# Patient Record
Sex: Female | Born: 1985 | Race: White | Hispanic: No | Marital: Single | Smoking: Current every day smoker
Health system: Southern US, Community
[De-identification: ages and names within clinical notes are randomized; demographics above are authoritative.]

## PROBLEM LIST (undated history)

## (undated) DIAGNOSIS — F319 Bipolar disorder, unspecified: Secondary | ICD-10-CM

## (undated) DIAGNOSIS — N200 Calculus of kidney: Secondary | ICD-10-CM

## (undated) DIAGNOSIS — F419 Anxiety disorder, unspecified: Secondary | ICD-10-CM

## (undated) HISTORY — PX: TONSILLECTOMY: SUR1361

## (undated) HISTORY — PX: LEEP: SHX91

## (undated) HISTORY — PX: CERVICAL BIOPSY  W/ LOOP ELECTRODE EXCISION: SUR135

---
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
## 2008-04-14 ENCOUNTER — Emergency Department (HOSPITAL_COMMUNITY): Admission: EM | Admit: 2008-04-14 | Discharge: 2008-04-15 | Payer: Self-pay | Admitting: Emergency Medicine

## 2008-05-09 ENCOUNTER — Emergency Department (HOSPITAL_COMMUNITY): Admission: EM | Admit: 2008-05-09 | Discharge: 2008-05-10 | Payer: Self-pay | Admitting: Emergency Medicine

## 2008-06-30 ENCOUNTER — Emergency Department (HOSPITAL_COMMUNITY): Admission: EM | Admit: 2008-06-30 | Discharge: 2008-07-01 | Payer: Self-pay | Admitting: Emergency Medicine

## 2008-07-25 ENCOUNTER — Emergency Department (HOSPITAL_COMMUNITY): Admission: EM | Admit: 2008-07-25 | Discharge: 2008-07-26 | Payer: Self-pay | Admitting: Emergency Medicine

## 2008-08-15 ENCOUNTER — Emergency Department (HOSPITAL_COMMUNITY): Admission: EM | Admit: 2008-08-15 | Discharge: 2008-08-15 | Payer: Self-pay | Admitting: Emergency Medicine

## 2008-08-24 ENCOUNTER — Emergency Department (HOSPITAL_COMMUNITY): Admission: EM | Admit: 2008-08-24 | Discharge: 2008-08-24 | Payer: Self-pay | Admitting: Emergency Medicine

## 2008-09-14 ENCOUNTER — Emergency Department (HOSPITAL_COMMUNITY): Admission: EM | Admit: 2008-09-14 | Discharge: 2008-09-14 | Payer: Self-pay | Admitting: Emergency Medicine

## 2008-09-30 ENCOUNTER — Emergency Department (HOSPITAL_COMMUNITY): Admission: EM | Admit: 2008-09-30 | Discharge: 2008-09-30 | Payer: Self-pay | Admitting: Emergency Medicine

## 2008-11-09 ENCOUNTER — Emergency Department (HOSPITAL_COMMUNITY): Admission: EM | Admit: 2008-11-09 | Discharge: 2008-11-09 | Payer: Self-pay | Admitting: Emergency Medicine

## 2008-11-18 ENCOUNTER — Emergency Department (HOSPITAL_COMMUNITY): Admission: EM | Admit: 2008-11-18 | Discharge: 2008-11-18 | Payer: Self-pay | Admitting: Emergency Medicine

## 2008-12-05 ENCOUNTER — Emergency Department (HOSPITAL_COMMUNITY): Admission: EM | Admit: 2008-12-05 | Discharge: 2008-12-05 | Payer: Self-pay | Admitting: Emergency Medicine

## 2008-12-22 ENCOUNTER — Emergency Department (HOSPITAL_COMMUNITY): Admission: EM | Admit: 2008-12-22 | Discharge: 2008-12-22 | Payer: Self-pay | Admitting: Emergency Medicine

## 2008-12-23 ENCOUNTER — Emergency Department (HOSPITAL_COMMUNITY): Admission: EM | Admit: 2008-12-23 | Discharge: 2008-12-23 | Payer: Self-pay | Admitting: Emergency Medicine

## 2009-06-22 ENCOUNTER — Emergency Department (HOSPITAL_BASED_OUTPATIENT_CLINIC_OR_DEPARTMENT_OTHER): Admission: EM | Admit: 2009-06-22 | Discharge: 2009-06-22 | Payer: Self-pay | Admitting: Emergency Medicine

## 2009-07-29 ENCOUNTER — Emergency Department (HOSPITAL_BASED_OUTPATIENT_CLINIC_OR_DEPARTMENT_OTHER): Admission: EM | Admit: 2009-07-29 | Discharge: 2009-07-29 | Payer: Self-pay | Admitting: Emergency Medicine

## 2009-07-29 ENCOUNTER — Ambulatory Visit: Payer: Self-pay | Admitting: Diagnostic Radiology

## 2009-08-24 ENCOUNTER — Emergency Department (HOSPITAL_BASED_OUTPATIENT_CLINIC_OR_DEPARTMENT_OTHER): Admission: EM | Admit: 2009-08-24 | Discharge: 2009-08-24 | Payer: Self-pay | Admitting: Emergency Medicine

## 2009-09-18 ENCOUNTER — Emergency Department (HOSPITAL_BASED_OUTPATIENT_CLINIC_OR_DEPARTMENT_OTHER): Admission: EM | Admit: 2009-09-18 | Discharge: 2009-09-18 | Payer: Self-pay | Admitting: Emergency Medicine

## 2009-09-18 ENCOUNTER — Ambulatory Visit: Payer: Self-pay | Admitting: Interventional Radiology

## 2009-09-30 ENCOUNTER — Emergency Department (HOSPITAL_BASED_OUTPATIENT_CLINIC_OR_DEPARTMENT_OTHER): Admission: EM | Admit: 2009-09-30 | Discharge: 2009-09-30 | Payer: Self-pay | Admitting: Emergency Medicine

## 2009-09-30 ENCOUNTER — Ambulatory Visit: Payer: Self-pay | Admitting: Diagnostic Radiology

## 2009-10-15 ENCOUNTER — Ambulatory Visit: Payer: Self-pay | Admitting: Diagnostic Radiology

## 2009-10-15 ENCOUNTER — Emergency Department (HOSPITAL_BASED_OUTPATIENT_CLINIC_OR_DEPARTMENT_OTHER): Admission: EM | Admit: 2009-10-15 | Discharge: 2009-10-15 | Payer: Self-pay | Admitting: Emergency Medicine

## 2009-10-18 ENCOUNTER — Emergency Department (HOSPITAL_BASED_OUTPATIENT_CLINIC_OR_DEPARTMENT_OTHER): Admission: EM | Admit: 2009-10-18 | Discharge: 2009-10-18 | Payer: Self-pay | Admitting: Emergency Medicine

## 2009-10-29 ENCOUNTER — Emergency Department (HOSPITAL_BASED_OUTPATIENT_CLINIC_OR_DEPARTMENT_OTHER): Admission: EM | Admit: 2009-10-29 | Discharge: 2009-10-29 | Payer: Self-pay | Admitting: Emergency Medicine

## 2009-11-21 ENCOUNTER — Emergency Department (HOSPITAL_BASED_OUTPATIENT_CLINIC_OR_DEPARTMENT_OTHER): Admission: EM | Admit: 2009-11-21 | Discharge: 2009-11-21 | Payer: Self-pay | Admitting: Emergency Medicine

## 2009-11-21 ENCOUNTER — Ambulatory Visit: Payer: Self-pay | Admitting: Diagnostic Radiology

## 2010-01-12 ENCOUNTER — Emergency Department (HOSPITAL_BASED_OUTPATIENT_CLINIC_OR_DEPARTMENT_OTHER): Admission: EM | Admit: 2010-01-12 | Discharge: 2010-01-12 | Payer: Self-pay | Admitting: Emergency Medicine

## 2010-01-21 ENCOUNTER — Emergency Department (HOSPITAL_BASED_OUTPATIENT_CLINIC_OR_DEPARTMENT_OTHER)
Admission: EM | Admit: 2010-01-21 | Discharge: 2010-01-21 | Payer: Self-pay | Source: Home / Self Care | Admitting: Emergency Medicine

## 2010-02-06 ENCOUNTER — Emergency Department (HOSPITAL_BASED_OUTPATIENT_CLINIC_OR_DEPARTMENT_OTHER)
Admission: EM | Admit: 2010-02-06 | Discharge: 2010-02-07 | Payer: Self-pay | Source: Home / Self Care | Admitting: Emergency Medicine

## 2010-03-16 ENCOUNTER — Emergency Department (HOSPITAL_BASED_OUTPATIENT_CLINIC_OR_DEPARTMENT_OTHER)
Admission: EM | Admit: 2010-03-16 | Discharge: 2010-03-17 | Disposition: A | Discharge status: Home / Self Care | Payer: Medicaid Other | Attending: Emergency Medicine | Admitting: Emergency Medicine

## 2010-03-16 DIAGNOSIS — M5430 Sciatica, unspecified side: Secondary | ICD-10-CM | POA: Insufficient documentation

## 2010-03-16 DIAGNOSIS — F319 Bipolar disorder, unspecified: Secondary | ICD-10-CM | POA: Insufficient documentation

## 2010-03-16 DIAGNOSIS — M549 Dorsalgia, unspecified: Secondary | ICD-10-CM | POA: Insufficient documentation

## 2010-03-16 DIAGNOSIS — J45909 Unspecified asthma, uncomplicated: Secondary | ICD-10-CM | POA: Insufficient documentation

## 2010-03-16 DIAGNOSIS — F17200 Nicotine dependence, unspecified, uncomplicated: Secondary | ICD-10-CM | POA: Insufficient documentation

## 2010-04-18 ENCOUNTER — Emergency Department (HOSPITAL_BASED_OUTPATIENT_CLINIC_OR_DEPARTMENT_OTHER)
Admission: EM | Admit: 2010-04-18 | Discharge: 2010-04-19 | Disposition: A | Discharge status: Home / Self Care | Payer: Medicaid Other | Attending: Emergency Medicine | Admitting: Emergency Medicine

## 2010-04-18 DIAGNOSIS — J069 Acute upper respiratory infection, unspecified: Secondary | ICD-10-CM | POA: Insufficient documentation

## 2010-04-18 DIAGNOSIS — F319 Bipolar disorder, unspecified: Secondary | ICD-10-CM | POA: Insufficient documentation

## 2010-04-18 DIAGNOSIS — F17200 Nicotine dependence, unspecified, uncomplicated: Secondary | ICD-10-CM | POA: Insufficient documentation

## 2010-04-23 LAB — DIFFERENTIAL
Basophils Relative: 0 % (ref 0–1)
Lymphs Abs: 1.8 10*3/uL (ref 0.7–4.0)
Monocytes Relative: 6 % (ref 3–12)
Neutro Abs: 7.1 10*3/uL (ref 1.7–7.7)
Neutrophils Relative %: 75 % (ref 43–77)

## 2010-04-23 LAB — URINALYSIS, ROUTINE W REFLEX MICROSCOPIC
Bilirubin Urine: NEGATIVE
Glucose, UA: NEGATIVE mg/dL
Ketones, ur: NEGATIVE mg/dL
Protein, ur: NEGATIVE mg/dL
pH: 7.5 (ref 5.0–8.0)

## 2010-04-23 LAB — GC/CHLAMYDIA PROBE AMP, GENITAL
Chlamydia, DNA Probe: NEGATIVE
GC Probe Amp, Genital: NEGATIVE

## 2010-04-23 LAB — CBC
Hemoglobin: 13 g/dL (ref 12.0–15.0)
Platelets: 185 10*3/uL (ref 150–400)
RBC: 4.05 MIL/uL (ref 3.87–5.11)
WBC: 9.5 10*3/uL (ref 4.0–10.5)

## 2010-04-23 LAB — PREGNANCY, URINE: Preg Test, Ur: NEGATIVE

## 2010-04-23 LAB — COMPREHENSIVE METABOLIC PANEL
BUN: 7 mg/dL (ref 6–23)
Calcium: 9.7 mg/dL (ref 8.4–10.5)
GFR calc Af Amer: >60 mL/min (ref >60)
Glucose, Bld: 87 mg/dL (ref 70–99)
Potassium: 3.8 mEq/L (ref 3.5–5.1)
Sodium: 145 mEq/L (ref 135–145)
Total Protein: 7.7 g/dL (ref 6.0–8.3)

## 2010-04-23 LAB — LIPASE, BLOOD: Lipase: 41 U/L (ref 23–300)

## 2010-04-23 LAB — WET PREP, GENITAL

## 2010-04-23 LAB — URINE MICROSCOPIC-ADD ON

## 2010-04-26 LAB — CBC
MCH: 33.1 pg (ref 26.0–34.0)
MCHC: 33.5 g/dL (ref 30.0–36.0)
MCV: 98.6 fL (ref 78.0–100.0)
Platelets: 188 10*3/uL (ref 150–400)
RDW: 13.7 % (ref 11.5–15.5)

## 2010-04-26 LAB — DIFFERENTIAL
Basophils Absolute: 0.1 10*3/uL (ref 0.0–0.1)
Basophils Relative: 1 % (ref 0–1)
Eosinophils Absolute: 0.1 10*3/uL (ref 0.0–0.7)
Eosinophils Relative: 1 % (ref 0–5)
Neutrophils Relative %: 72 % (ref 43–77)

## 2010-04-26 LAB — WET PREP, GENITAL

## 2010-04-26 LAB — PREGNANCY, URINE: Preg Test, Ur: NEGATIVE

## 2010-04-26 LAB — URINALYSIS, ROUTINE W REFLEX MICROSCOPIC
Glucose, UA: NEGATIVE mg/dL
Ketones, ur: NEGATIVE mg/dL
Nitrite: NEGATIVE
Protein, ur: 30 mg/dL — AB
pH: 6.5 (ref 5.0–8.0)

## 2010-04-26 LAB — BASIC METABOLIC PANEL
BUN: 11 mg/dL (ref 6–23)
CO2: 27 mEq/L (ref 19–32)
Calcium: 9.2 mg/dL (ref 8.4–10.5)
Chloride: 108 mEq/L (ref 96–112)
Creatinine, Ser: 0.7 mg/dL (ref 0.4–1.2)

## 2010-04-26 LAB — URINE MICROSCOPIC-ADD ON

## 2010-04-26 LAB — GC/CHLAMYDIA PROBE AMP, GENITAL: GC Probe Amp, Genital: NEGATIVE

## 2010-04-27 LAB — URINE MICROSCOPIC-ADD ON

## 2010-04-27 LAB — URINALYSIS, ROUTINE W REFLEX MICROSCOPIC
Glucose, UA: NEGATIVE mg/dL
Protein, ur: NEGATIVE mg/dL
Specific Gravity, Urine: 1.028 (ref 1.005–1.030)
pH: 6 (ref 5.0–8.0)

## 2010-04-27 LAB — URINE CULTURE: Culture  Setup Time: 201108200644

## 2010-05-16 LAB — URINALYSIS, ROUTINE W REFLEX MICROSCOPIC
Ketones, ur: NEGATIVE mg/dL
Nitrite: NEGATIVE
Protein, ur: NEGATIVE mg/dL
pH: 7 (ref 5.0–8.0)

## 2010-05-16 LAB — POCT PREGNANCY, URINE: Preg Test, Ur: NEGATIVE

## 2010-05-16 LAB — RAPID URINE DRUG SCREEN, HOSP PERFORMED: Cocaine: NOT DETECTED

## 2010-05-19 LAB — URINALYSIS, ROUTINE W REFLEX MICROSCOPIC
Hgb urine dipstick: NEGATIVE
Ketones, ur: NEGATIVE mg/dL
Protein, ur: NEGATIVE mg/dL
Urobilinogen, UA: 1 mg/dL (ref 0.0–1.0)

## 2010-05-19 LAB — CBC
HCT: 35.6 % — ABNORMAL LOW (ref 36.0–46.0)
Hemoglobin: 12.2 g/dL (ref 12.0–15.0)
WBC: 9.9 10*3/uL (ref 4.0–10.5)

## 2010-05-19 LAB — COMPREHENSIVE METABOLIC PANEL
Alkaline Phosphatase: 81 U/L (ref 39–117)
BUN: 9 mg/dL (ref 6–23)
Chloride: 107 mEq/L (ref 96–112)
Glucose, Bld: 96 mg/dL (ref 70–99)
Potassium: 4.1 mEq/L (ref 3.5–5.1)
Total Bilirubin: 0.4 mg/dL (ref 0.3–1.2)

## 2010-05-19 LAB — DIFFERENTIAL
Basophils Absolute: 0 10*3/uL (ref 0.0–0.1)
Basophils Relative: 0 % (ref 0–1)
Monocytes Absolute: 0.7 10*3/uL (ref 0.1–1.0)
Neutro Abs: 6.4 10*3/uL (ref 1.7–7.7)
Neutrophils Relative %: 64 % (ref 43–77)

## 2010-05-19 LAB — POCT PREGNANCY, URINE: Preg Test, Ur: NEGATIVE

## 2010-05-20 LAB — DIFFERENTIAL
Basophils Absolute: 0 10*3/uL (ref 0.0–0.1)
Basophils Relative: 0 % (ref 0–1)
Basophils Relative: 1 % (ref 0–1)
Eosinophils Absolute: 0.1 10*3/uL (ref 0.0–0.7)
Lymphocytes Relative: 13 % (ref 12–46)
Lymphs Abs: 2.1 10*3/uL (ref 0.7–4.0)
Monocytes Absolute: 0.4 10*3/uL (ref 0.1–1.0)
Monocytes Absolute: 0.8 10*3/uL (ref 0.1–1.0)
Monocytes Relative: 8 % (ref 3–12)
Neutro Abs: 7 10*3/uL (ref 1.7–7.7)
Neutrophils Relative %: 81 % — ABNORMAL HIGH (ref 43–77)

## 2010-05-20 LAB — CBC
HCT: 36.4 % (ref 36.0–46.0)
HCT: 39 % (ref 36.0–46.0)
Hemoglobin: 12.6 g/dL (ref 12.0–15.0)
Hemoglobin: 13.5 g/dL (ref 12.0–15.0)
MCHC: 34.7 g/dL (ref 30.0–36.0)
MCV: 96.5 fL (ref 78.0–100.0)
MCV: 96.6 fL (ref 78.0–100.0)
Platelets: 224 10*3/uL (ref 150–400)
RBC: 3.77 MIL/uL — ABNORMAL LOW (ref 3.87–5.11)
WBC: 8.7 10*3/uL (ref 4.0–10.5)

## 2010-05-20 LAB — COMPREHENSIVE METABOLIC PANEL
Albumin: 4 g/dL (ref 3.5–5.2)
Alkaline Phosphatase: 77 U/L (ref 39–117)
BUN: 12 mg/dL (ref 6–23)
BUN: 6 mg/dL (ref 6–23)
CO2: 25 mEq/L (ref 19–32)
Calcium: 9.1 mg/dL (ref 8.4–10.5)
Chloride: 106 mEq/L (ref 96–112)
Chloride: 109 mEq/L (ref 96–112)
Creatinine, Ser: 0.6 mg/dL (ref 0.4–1.2)
Creatinine, Ser: 0.68 mg/dL (ref 0.4–1.2)
GFR calc Af Amer: >60 mL/min (ref >60)
GFR calc non Af Amer: >60 mL/min (ref >60)
GFR calc non Af Amer: >60 mL/min (ref >60)
Glucose, Bld: 101 mg/dL — ABNORMAL HIGH (ref 70–99)
Glucose, Bld: 86 mg/dL (ref 70–99)
Potassium: 4.1 mEq/L (ref 3.5–5.1)
Total Bilirubin: 0.6 mg/dL (ref 0.3–1.2)
Total Bilirubin: 0.8 mg/dL (ref 0.3–1.2)

## 2010-05-20 LAB — GC/CHLAMYDIA PROBE AMP, GENITAL
Chlamydia, DNA Probe: NEGATIVE
GC Probe Amp, Genital: NEGATIVE

## 2010-05-20 LAB — URINALYSIS, ROUTINE W REFLEX MICROSCOPIC
Bilirubin Urine: NEGATIVE
Bilirubin Urine: NEGATIVE
Glucose, UA: NEGATIVE mg/dL
Hgb urine dipstick: NEGATIVE
Hgb urine dipstick: NEGATIVE
Ketones, ur: 15 mg/dL — AB
Nitrite: NEGATIVE
Protein, ur: NEGATIVE mg/dL
Urobilinogen, UA: 1 mg/dL (ref 0.0–1.0)
pH: 6.5 (ref 5.0–8.0)

## 2010-05-20 LAB — WET PREP, GENITAL
Trich, Wet Prep: NONE SEEN
Yeast Wet Prep HPF POC: NONE SEEN

## 2010-05-21 LAB — COMPREHENSIVE METABOLIC PANEL
ALT: 11 U/L (ref 0–35)
Alkaline Phosphatase: 87 U/L (ref 39–117)
BUN: 8 mg/dL (ref 6–23)
CO2: 25 mEq/L (ref 19–32)
Calcium: 10.1 mg/dL (ref 8.4–10.5)
GFR calc non Af Amer: >60 mL/min (ref >60)
Glucose, Bld: 89 mg/dL (ref 70–99)
Sodium: 143 mEq/L (ref 135–145)
Total Protein: 7.7 g/dL (ref 6.0–8.3)

## 2010-05-21 LAB — DIFFERENTIAL
Basophils Relative: 0 % (ref 0–1)
Eosinophils Absolute: 0.1 10*3/uL (ref 0.0–0.7)
Lymphs Abs: 1.4 10*3/uL (ref 0.7–4.0)
Neutro Abs: 8.7 10*3/uL — ABNORMAL HIGH (ref 1.7–7.7)
Neutrophils Relative %: 82 % — ABNORMAL HIGH (ref 43–77)

## 2010-05-21 LAB — CBC
HCT: 41.3 % (ref 36.0–46.0)
Hemoglobin: 14.2 g/dL (ref 12.0–15.0)
MCHC: 34.3 g/dL (ref 30.0–36.0)
RBC: 4.3 MIL/uL (ref 3.87–5.11)
RDW: 13.2 % (ref 11.5–15.5)

## 2010-05-21 LAB — URINALYSIS, ROUTINE W REFLEX MICROSCOPIC
Bilirubin Urine: NEGATIVE
Glucose, UA: NEGATIVE mg/dL
Hgb urine dipstick: NEGATIVE
Ketones, ur: NEGATIVE mg/dL
Protein, ur: NEGATIVE mg/dL
pH: 8 (ref 5.0–8.0)

## 2010-05-21 LAB — LIPASE, BLOOD: Lipase: 17 U/L (ref 11–59)

## 2010-05-22 LAB — HEPATIC FUNCTION PANEL
ALT: 17 U/L (ref 0–35)
Albumin: 4 g/dL (ref 3.5–5.2)
Alkaline Phosphatase: 98 U/L (ref 39–117)
Indirect Bilirubin: 0.6 mg/dL (ref 0.3–0.9)
Total Protein: 7.3 g/dL (ref 6.0–8.3)

## 2010-05-22 LAB — URINALYSIS, ROUTINE W REFLEX MICROSCOPIC
Ketones, ur: NEGATIVE mg/dL
Nitrite: NEGATIVE
Protein, ur: NEGATIVE mg/dL
Urobilinogen, UA: 1 mg/dL (ref 0.0–1.0)

## 2010-05-22 LAB — LIPASE, BLOOD: Lipase: 20 U/L (ref 11–59)

## 2010-05-22 LAB — CBC
HCT: 40.2 % (ref 36.0–46.0)
Hemoglobin: 14.2 g/dL (ref 12.0–15.0)
MCV: 96.2 fL (ref 78.0–100.0)
RBC: 4.18 MIL/uL (ref 3.87–5.11)
WBC: 13.6 10*3/uL — ABNORMAL HIGH (ref 4.0–10.5)

## 2010-05-22 LAB — DIFFERENTIAL
Eosinophils Absolute: 0.1 10*3/uL (ref 0.0–0.7)
Eosinophils Relative: 1 % (ref 0–5)
Lymphs Abs: 2.1 10*3/uL (ref 0.7–4.0)
Monocytes Absolute: 0.7 10*3/uL (ref 0.1–1.0)
Monocytes Relative: 5 % (ref 3–12)
Neutrophils Relative %: 78 % — ABNORMAL HIGH (ref 43–77)

## 2010-05-22 LAB — URINE MICROSCOPIC-ADD ON

## 2010-05-22 LAB — POCT I-STAT, CHEM 8
BUN: 11 mg/dL (ref 6–23)
HCT: 42 % (ref 36.0–46.0)
Potassium: 3.8 mEq/L (ref 3.5–5.1)
TCO2: 23 mmol/L (ref 0–100)

## 2010-05-24 LAB — URINALYSIS, ROUTINE W REFLEX MICROSCOPIC
Bilirubin Urine: NEGATIVE
Glucose, UA: NEGATIVE mg/dL
Ketones, ur: NEGATIVE mg/dL
Nitrite: NEGATIVE
Specific Gravity, Urine: 1.034 — ABNORMAL HIGH (ref 1.005–1.030)
Specific Gravity, Urine: 1.023 (ref 1.005–1.030)
Urobilinogen, UA: 0.2 mg/dL (ref 0.0–1.0)
pH: 8 (ref 5.0–8.0)

## 2010-05-24 LAB — COMPREHENSIVE METABOLIC PANEL
ALT: 10 U/L (ref 0–35)
Albumin: 3.7 g/dL (ref 3.5–5.2)
Alkaline Phosphatase: 78 U/L (ref 39–117)
BUN: 9 mg/dL (ref 6–23)
Chloride: 107 mEq/L (ref 96–112)
Potassium: 3.9 mEq/L (ref 3.5–5.1)
Sodium: 138 mEq/L (ref 135–145)
Total Bilirubin: 0.6 mg/dL (ref 0.3–1.2)
Total Protein: 6.7 g/dL (ref 6.0–8.3)

## 2010-05-24 LAB — URINE MICROSCOPIC-ADD ON

## 2010-05-24 LAB — PREGNANCY, URINE: Preg Test, Ur: NEGATIVE

## 2010-05-29 ENCOUNTER — Emergency Department (HOSPITAL_BASED_OUTPATIENT_CLINIC_OR_DEPARTMENT_OTHER)
Admission: EM | Admit: 2010-05-29 | Discharge: 2010-05-29 | Disposition: A | Discharge status: Home / Self Care | Payer: Medicaid Other | Attending: Emergency Medicine | Admitting: Emergency Medicine

## 2010-05-29 DIAGNOSIS — N390 Urinary tract infection, site not specified: Secondary | ICD-10-CM | POA: Insufficient documentation

## 2010-05-29 DIAGNOSIS — J45909 Unspecified asthma, uncomplicated: Secondary | ICD-10-CM | POA: Insufficient documentation

## 2010-05-29 DIAGNOSIS — Z79899 Other long term (current) drug therapy: Secondary | ICD-10-CM | POA: Insufficient documentation

## 2010-05-29 DIAGNOSIS — R109 Unspecified abdominal pain: Secondary | ICD-10-CM | POA: Insufficient documentation

## 2010-05-29 DIAGNOSIS — F319 Bipolar disorder, unspecified: Secondary | ICD-10-CM | POA: Insufficient documentation

## 2010-05-29 DIAGNOSIS — F17200 Nicotine dependence, unspecified, uncomplicated: Secondary | ICD-10-CM | POA: Insufficient documentation

## 2010-05-29 LAB — URINALYSIS, ROUTINE W REFLEX MICROSCOPIC
Hgb urine dipstick: NEGATIVE
Nitrite: NEGATIVE
Specific Gravity, Urine: 1.02 (ref 1.005–1.030)
Urobilinogen, UA: 1 mg/dL (ref 0.0–1.0)

## 2010-05-29 LAB — PREGNANCY, URINE: Preg Test, Ur: NEGATIVE

## 2010-05-29 LAB — WET PREP, GENITAL
Clue Cells Wet Prep HPF POC: NONE SEEN
Trich, Wet Prep: NONE SEEN

## 2010-05-29 LAB — URINE MICROSCOPIC-ADD ON

## 2010-05-31 LAB — URINE CULTURE: Colony Count: >=100000

## 2010-06-29 NOTE — Discharge Summary (Signed)
NAME:  [name], [name]                        ACCOUNT NO.:  [id number]   MEDICAL RECORD NO.:  [id number]                   PATIENT TYPE:  INP   LOCATION:  0105                                 [facility]ACILITY:  [facility]   PHYSICIAN:  Cindie Crumbly, M.D.               DATE OF BIRTH:  06-04-1985   DATE OF ADMISSION:  01/17/2002  DATE OF DISCHARGE:  01/26/2002                                 DISCHARGE SUMMARY   REASON FOR ADMISSION:  This 25 year old white female was admitted for  inpatient psychiatric stabilization because of a history of suicidal  ideation and a plan to harm herself.  For further history of present  illness, please see the patient's psychiatric admission assessment.   PHYSICAL EXAMINATION:  Physical examination at the time of admission was  significant for obesity and was otherwise unremarkable.   LABORATORY DATA:  The patient underwent a laboratory workup to rule out any  other medical problems contributing to her symptomatology.  Urine probe for  gonorrhea and Chlamydia were negative.  Urine drug screen was negative.  CBC  was unremarkable.  UA was unremarkable.  Routine chemistry panel was within  normal limits.  Hepatic panel was within normal limits.  GGT was within  normal limits.  TSH and free T4 were within normal limits.  RPR was  nonreactive.  The patient received no x-rays, no special procedures, no  additional consultations.  She sustained no complications during the course  of this hospitalization.   HOSPITAL COURSE:  On admission, the patient displayed depressed, irritable,  angry, grandiose, and labile mood.  She was oppositional, defiant, gamey,  and manipulative, psychomotor agitated with decreased concentration.  Her  speech was pressured with flight of ideas.  She was intrusive, easily  distracted by extraneous stimuli.  She was begun on a trial of lithium.  Initial blood level was 0.5 and the patient was titrated upward to a more  therapeutic dose.  At  the time of discharge, her blood level was 0.76.  She  denies any side effects.  She is tolerating this medicine well without any  difficulty.  She is actively participating in all aspects of the therapeutic  treatment program, no longer appears to be danger to herself or others and  consequently is felt to have reached her maximum benefits of hospitalization  and is ready for discharge to a less restrictive alternative setting.   CONDITION ON DISCHARGE:  Her condition on discharge is improved.   DIAGNOSES:  Diagnoses according to DSM-IV:   AXIS I:  1. Bipolar disorder, mixed type, severe without psychosis.  2. Conduct disorder.  3. Rule out oppositional defiant disorder.   AXIS II:  1. Borderline traits.  2. Rule out personality disorder, not otherwise specified.  3. Rule out learning disorder, not otherwise specified.   AXIS III:  Obesity.   AXIS IV:  Severe.   AXIS V:  20 on admission,  30 on discharge.   FURTHER EVALUATION AND TREATMENT RECOMMENDATIONS:  1. The patient is discharged to home.  2. She is discharged on an unrestricted level of activity and a regular     diet.  3. She will follow up with [facility] for all     further aspects of her psychiatric care and consequently, I will sign off     on the case at this time.   DISCHARGE MEDICATIONS:  1. Eskalith CR 450 mg p.o. b.i.d.  2. Trazodone 100 mg p.o. q.h.s.  3. Pulmicort inhaler one puff each morning and at bedtime.  4. Nasonex 500 mcg nasal spray as needed p.r.n. for nasal congestion.  5. Albuterol inhaler two puffs q.4h. p.r.n. for wheezing.  6. Imitrex 25 mg p.o. q.1h., not to exceed 200 mg in 24 hours p.r.n. for     migraine headaches.                                                Cindie Crumbly, M.D.    TS/MEDQ  D:  01/26/2002  T:  01/26/2002  Job:  [id number]

## 2010-06-29 NOTE — H&P (Signed)
[facility]  Patient:    [name], [name] Visit Number: [id number] MRN: [id number]          Service Type: PSY Location: 100 0100 02 Attending Physician:  Veneta Penton. Dictated by:   Veneta Penton, M.D. Admit Date:  05/27/2001                     Psychiatric Admission Assessment  DATE OF ADMISSION:  May 27, 2001.  REASON FOR ADMISSION:  This 25 year old white female was admitted complaining of depression, status post overdose with Ambien as a suicide attempt.  She refused to contract for safety.  HISTORY OF PRESENT ILLNESS:  The patient complains of an increasingly depressed, irritable, and angry mood most of the day nearly every day over the past month, along with anhedonia, decreased school performance, giving up on activities previously enjoyed, hypersomnia, weight gain, feelings of hopelessness, helplessness, worthlessness, decreased concentration and energy level, increased symptoms of fatigue, and recurrent thoughts of death.  She left a suicide note which she had given her mother.  Her psychosocial stressors include the fact that she has had frequent arguments with her mother and that she reports that her best friend, who moved to [place], committed suicide 2 weeks ago, and she was recently just told of this.  PAST PSYCHIATRIC HISTORY:  Significant for recurrent episodes of major depression as well as history of oppositional-defiant disorder.  She is followed at [facility] on an outpatient basis.  She has a history of a possible conduct disorder, including episodes of truancy. DRUG AND ALCOHOL ABUSE HISTORY:  She has no history of tobacco use, alcohol or street drug use.  PAST MEDICAL HISTORY:  Significant for tympanostomy tubes in her ears as a young child.  She had an adenoidectomy due to chronic sinusitis in April of 2002.  She has a history of obesity, acne vulgaris, allergic rhinitis, and migraine  headaches.  DRUG ALLERGIES:  She is allergic to AUGMENTIN.  She denies any other allergies to any medicines.  CURRENT MEDICATIONS:  Celexa 20 mg p.o. q.d. which she reports taking for the past 2-3 months.  She states that she still cannot concentrate and that she has seen no improvement with  the depression, although her mother reports that she is somewhat less depressed, but clearly only has a partial response to the Celexa.  She is also taking Allegra D p.o. b.i.d. for her allergic rhinitis. She also takes Rhinocort on a p.r.n. basis, and Imitrex in combination with Phenergan on a p.r.n. basis for migraine headaches.  STRENGTHS AND ASSETS:  Her mother is very supportive of her.  FAMILY AND SOCIAL HISTORY:  The patient lives with her mother, 57-year-old and 21 year old sisters.  Mother lost her job in January of 2003.  Father has no contact with the patient for the past 9 months.  Grandmother died approximately 1-1/2 years ago and the patient was very close to her.  The patient is currently in the 8th grade and failing all of her classes.  MENTAL STATUS EXAMINATION:  The patient presents as well-developed, well- nourished obese, adolescent white female, who is alert, oriented x 4, psychomotor agitated and whose appearance is compatible with her stated age. Her speech is coherent with a decreased rate and volume of speech and increased speech latency.  She displays no looseness of associations, phonemic errors or evidence of a thought disorder.  She is disheveled and unkempt.  Her affect and mood are depressed,  irritable, and angry.  Her concentration is decreased.  She displays poor impulse control.  She is oppositional and defiant.  Her immediate recall, short term memory and remote memory are intact.  Similarities and differences are within normal limits.  Her proverbs are somewhat concrete and consistent with her educational level.  ADMISSION DIAGNOSES: Axis I:    1. Major  depression, recurrent, severe, without psychosis.            2. Oppositional-defiant disorder.            3. Rule out conduct disorder. Axis II:   1. Rule out learning disorder not otherwise specified.            2. Rule out personality disorder not otherwise specified. Axis III:  1. Obesity.            2. Acne vulgaris.            3. Allergic rhinitis.            4. Migraine. Axis IV:   Current psychosocial stressors are severe. Axis V:    Code 20.  FURTHER EVALUATION AND TREATMENT RECOMMENDATIONS:  ESTIMATED LENGTH OF STAY ON THE INPATIENT UNIT:  Five to seven days.  INITIAL DISCHARGE PLAN:  To discharge the patient to home.  INITIAL PLAN OF CARE:  To begin the patient on a trial of Effexor XR and discontinue Celexa.  Psychotherapy will focus on  improving the patients impulse control, decreasing cognitive distortions and potential for self harm.  A laboratory workup will also be initiated to rule out any other medical problems contributing to her symptomatology. Dictated by:   Veneta Penton, M.D. Attending Physician:  Veneta Penton DD:  05/28/01 TD:  05/31/01 Job: [id number]

## 2010-06-29 NOTE — H&P (Signed)
NAME:  [name], [name]                        ACCOUNT NO.:  [id number]   MEDICAL RECORD NO.:  [id number]                   PATIENT TYPE:  INP   LOCATION:  0105                                 [facility]ILITY[facility]   PHYSICIAN:  Nawar M. Alnaquib, M.D.             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  She referred herself  here for admission.   She reports that she was not suicidal but she was feeling increasingly  depressed because of a lot of arguing in the family.  Her mom and her mom's  boyfriend, uncle, grandfather all argue together and with her.  The patient  does not like it and does not know what to do.  Feels very frustrated with  the situation and tries to get out of the room usually but it seems that the  depression was getting more and more and sometimes with suicidal ideation.   JUSTIFICATION OF 24-HOUR CARE:  Failure of treatment at lower level of care.   PAST PSYCHIATRIC HISTORY:  One history of admission to the [facility] in April of 2003 at [place] time with depression plus a  suicide attempt.  That was the only admission she has had so far.  She has  been seen in counseling at the mental health center in [place], Ms.  Cleta Alberts, her therapist, and she sees Dr. Ok Anis for her  psychiatric medications at [facility].  He diagnosed her as  bipolar disorder.   SUBSTANCE ABUSE HISTORY:  Completely denied.   PAST MEDICAL HISTORY:  None relevant and none remarkable.  Mild obesity and  possible nasal allergies and asthma for which she is taking  albuterol.   CURRENT MEDICATIONS:  Lithium 300 mg at bedtime (which was started on  January 01, 2002) and albuterol as per needed.   MENTAL STATUS EXAM:  She is somewhat with a flat affect.  Reports that she  is somewhat depressed but not suicidal nor homicidal.  She reports auditory  hallucinations off and on by history.  However, she is experiencing none at  present.  She reports a lot of anxiety, a lot of worry.  She denies any  panic.  She denies any phobias.  She denies obsessive symptoms.  Her insight  and judgment appear to be fair.  Her cognitive abilities appear to be good.   STRENGTHS/ASSETS:  Good IQ; A-B Occupational psychologist; A and B grades.   FAMILY/SOCIAL/ENVIRONMENTAL ISSUES:  Family history is positive for  grandmother having depression, mother having bipolar affective disorder.  Household consists of mom,  the patient and two sisters, ages 45 and 65.  There is also mom's boyfriend and grandfather and an uncle.  These are  members of the household but not always living with them.   ADMISSION DIAGNOSES:   AXIS I:  1. Major depressive disorder, recurrent.  2. Bipolar affective disorder, depressed.   AXIS II:  Deferred.   AXIS III:  1. Mild obesity.  2. Asthma by history.   AXIS IV:  Psychosocial problems, family relational problems.   AXIS V:  Global Assessment of Functioning 35.   ESTIMATED LENGTH OF STAY:  One week.   INITIAL DISCHARGE PLAN:  Home.   INITIAL PLAN OF CARE:  Continue lithium.  Lithium level was sent for.  No  level available yet.  Might need to go up on the dosage of the lithium from  300 mg q.h.s. to 300 mg b.i.d.  I started her on Celexa for her depression  20 mg q.h.s.  Will be starting this tonight, __________ to mother's consent.  Continue group and individual therapy.  Family therapy is also another  avenue that needs to be followed.                                               Nawar M. Alnaquib, M.D.    NMA/MEDQ  D:  01/17/2002   T:  01/17/2002  Job:  [id number]

## 2010-06-29 NOTE — Discharge Summary (Signed)
[facility]  Patient:    [name], [name] Visit Number: [id number] MRN: [id number]          Service Type: PSY Location: 100 0106 02 Attending Physician:  Veneta Penton Dictated by:   Veneta Penton, M.D. Admit Date:  05/27/2001 Disc. Date: 06/04/01                             Discharge Summary  REASON FOR ADMISSION:  This 25 year old white female was admitted complaining of depression status post overdose with Ambien as a suicide attempt.   For further history of present illness, please see the patients psychiatric admission assessment.  PHYSICAL EXAMINATION:  At the time of admission was significant for obesity, acne vulgaris, possibly history of polycystic ovarian disease, allergic rhinitis, and an ingrown toenail on the left great toe.  She had an otherwise unremarkable physical examination.  LABORATORY EXAMINATION:  The patient underwent a laboratory work-up to rule out any other medical problems contributing to her symptomatology.  A urine probe for gonorrhea and chlamydia were indeterminate.  Urine pregnancy test was negative.  TSH and free T4 were within normal limits.  Hepatic panel was within normal limits.  GGT was within normal limits.  UA was unremarkable.  An RPR was nonreactive.  The patient received no x-rays, no special procedures, no additional consultations.  She sustained no complications during the course of this hospitalization.  HOSPITAL COURSE:  On admission, the patient was psychomotor agitated, oppositional and defiant.  Her affect and mood were depressed, irritable, and angry.  Her concentration was decreased.  She displayed poor impulse control. She was discontinued from Celexa which appeared only to have a partial effect in improving her depressive symptoms and was begun on a trial of Effexor XR and titrated up to a therapeutic dose.  She tolerated this medication well, without side effects.  At the time of  discharge, she denies any homicidal or suicidal ideation.  Her affect and mood have improved.  She is less oppositional and defiant and is participating in family therapy without difficulty.  She is actively participating in all aspects of the therapeutic treatment program, is motivated for outpatient therapy and consequently is felt to have reached her maximum benefits of hospitalization and is ready for discharge to a less restricted alternative setting.  CONDITION ON DISCHARGE:  Improved  FINAL DIAGNOSIS: Axis I:    1. Major depression, recurrent type, severe, without psychosis.            2. Oppositional-defiant disorder.            3. Rule out conduct disorder. Axis II:   1. Rule out learning disorder not otherwise specified.            2. Rule out personality disorder not otherwise specified. Axis III:  1. Obesity.            2. Acne vulgaris.            3. Allergic rhinitis.            4. History of migraine headaches.            5. Ingrown toenail on the left great toe.            6. Rule out cystic ovarian disease. Axis IV:   Current psychosocial stressors are severe. Axis V:    Code 20 on admission, code 30 on discharge.  FURTHER EVALUATION  AND TREATMENT RECOMMENDATIONS: 1. The patient is discharged to home. 2. She is discharged on an unrestricted level of activity and a regular diet. 3. She will follow up with her primary care physician for all further aspects    of her medical care and with her outpatient psychiatrist at [facility] for all further aspects of her psychiatric care, and    consequently I will sign off on the case at this time.  DISCHARGE MEDICATIONS: 1. Effexor XR 75 mg p.o. q.a.m. with food. 2. She is also discharged on medications that were prescribed by her    primary care physician prior to admission and will continue to be    monitored by her primary care physician.  These include Allegra D, 1  p.o.    b.i.d., rhinocort nasal  spray b.i.d. p.r.n., Imitrex 50 mg p.o. one at the    onset of headache and in 2 hours if not relieved, not to exceed 100 mg in    24 hours, Phenergan 25 mg p.o. or IM q.6h. p.r.n. for nausea and    vomiting. Dictated by:   Veneta Penton, M.D. Attending Physician:  Veneta Penton DD:  06/04/01 TD:  06/04/01 Job: [id number]

## 2010-07-18 ENCOUNTER — Emergency Department (HOSPITAL_BASED_OUTPATIENT_CLINIC_OR_DEPARTMENT_OTHER)
Admission: EM | Admit: 2010-07-18 | Discharge: 2010-07-18 | Disposition: A | Discharge status: Home / Self Care | Payer: Medicaid Other | Attending: Emergency Medicine | Admitting: Emergency Medicine

## 2010-07-18 DIAGNOSIS — X500XXA Overexertion from strenuous movement or load, initial encounter: Secondary | ICD-10-CM | POA: Insufficient documentation

## 2010-07-18 DIAGNOSIS — N390 Urinary tract infection, site not specified: Secondary | ICD-10-CM | POA: Insufficient documentation

## 2010-07-18 DIAGNOSIS — Z79899 Other long term (current) drug therapy: Secondary | ICD-10-CM | POA: Insufficient documentation

## 2010-07-18 DIAGNOSIS — J45909 Unspecified asthma, uncomplicated: Secondary | ICD-10-CM | POA: Insufficient documentation

## 2010-07-18 DIAGNOSIS — R350 Frequency of micturition: Secondary | ICD-10-CM | POA: Insufficient documentation

## 2010-07-18 DIAGNOSIS — F319 Bipolar disorder, unspecified: Secondary | ICD-10-CM | POA: Insufficient documentation

## 2010-07-18 DIAGNOSIS — IMO0002 Reserved for concepts with insufficient information to code with codable children: Secondary | ICD-10-CM | POA: Insufficient documentation

## 2010-07-18 LAB — PREGNANCY, URINE: Preg Test, Ur: NEGATIVE

## 2010-07-18 LAB — URINALYSIS, ROUTINE W REFLEX MICROSCOPIC
Bilirubin Urine: NEGATIVE
Ketones, ur: 15 mg/dL — AB
Nitrite: NEGATIVE
Protein, ur: NEGATIVE mg/dL
Urobilinogen, UA: 1 mg/dL (ref 0.0–1.0)

## 2010-07-18 LAB — URINE MICROSCOPIC-ADD ON

## 2010-08-29 ENCOUNTER — Encounter: Payer: Self-pay | Admitting: *Deleted

## 2010-08-29 ENCOUNTER — Emergency Department (HOSPITAL_BASED_OUTPATIENT_CLINIC_OR_DEPARTMENT_OTHER)
Admission: EM | Admit: 2010-08-29 | Discharge: 2010-08-29 | Disposition: A | Discharge status: Home / Self Care | Payer: Medicaid Other | Attending: Emergency Medicine | Admitting: Emergency Medicine

## 2010-08-29 DIAGNOSIS — R109 Unspecified abdominal pain: Secondary | ICD-10-CM | POA: Insufficient documentation

## 2010-08-29 DIAGNOSIS — F411 Generalized anxiety disorder: Secondary | ICD-10-CM | POA: Insufficient documentation

## 2010-08-29 DIAGNOSIS — F319 Bipolar disorder, unspecified: Secondary | ICD-10-CM | POA: Insufficient documentation

## 2010-08-29 DIAGNOSIS — M129 Arthropathy, unspecified: Secondary | ICD-10-CM | POA: Insufficient documentation

## 2010-08-29 DIAGNOSIS — F17200 Nicotine dependence, unspecified, uncomplicated: Secondary | ICD-10-CM | POA: Insufficient documentation

## 2010-08-29 DIAGNOSIS — N760 Acute vaginitis: Secondary | ICD-10-CM | POA: Insufficient documentation

## 2010-08-29 HISTORY — DX: Anxiety disorder, unspecified: F41.9

## 2010-08-29 HISTORY — DX: Bipolar disorder, unspecified: F31.9

## 2010-08-29 LAB — URINALYSIS, ROUTINE W REFLEX MICROSCOPIC
Glucose, UA: NEGATIVE mg/dL
Hgb urine dipstick: NEGATIVE
Specific Gravity, Urine: 1.021 (ref 1.005–1.030)
Urobilinogen, UA: 1 mg/dL (ref 0.0–1.0)

## 2010-08-29 LAB — WET PREP, GENITAL
Trich, Wet Prep: NONE SEEN
Yeast Wet Prep HPF POC: NONE SEEN

## 2010-08-29 MED ORDER — METRONIDAZOLE 500 MG PO TABS: 500.0000 mg | ORAL_TABLET | Freq: Two times a day (BID) | ORAL | Status: AC

## 2010-08-29 MED ORDER — AZITHROMYCIN 1 G PO PACK
1.0000 g | PACK | Freq: Once | ORAL | Status: AC
  Administered 2010-08-29: 1 g via ORAL
  Filled 2010-08-29: qty 1

## 2010-08-29 MED ORDER — HYDROCODONE-ACETAMINOPHEN 5-325 MG PO TABS: 2.0000 | ORAL_TABLET | Freq: Once | ORAL | Status: DC

## 2010-08-29 MED ORDER — CEFTRIAXONE SODIUM 250 MG IJ SOLR
250.0000 mg | Freq: Once | INTRAMUSCULAR | Status: AC
  Administered 2010-08-29: 250 mg via INTRAMUSCULAR
  Filled 2010-08-29: qty 250

## 2010-08-29 MED ORDER — HYDROCODONE-ACETAMINOPHEN 5-325 MG PO TABS
2.0000 | ORAL_TABLET | Freq: Once | ORAL | Status: AC
  Administered 2010-08-29: 2 via ORAL
  Filled 2010-08-29: qty 2

## 2010-08-29 MED ORDER — CIPROFLOXACIN HCL 500 MG PO TABS: 500.0000 mg | ORAL_TABLET | Freq: Two times a day (BID) | ORAL | Status: AC

## 2010-08-29 NOTE — ED Notes (Signed)
Pt reports lower abdominal pain/back cramps and vaginal discharge since yesterday. Also reports nausea/vomitting.

## 2010-08-29 NOTE — ED Provider Notes (Addendum)
History     Chief Complaint  Patient presents with  . Abdominal Pain   Patient is a 25 y.o. female presenting with abdominal pain. The history is provided by the patient.  Abdominal Pain The primary symptoms of the illness include abdominal pain and vaginal discharge. The primary symptoms of the illness do not include fever, nausea, vomiting, diarrhea or vaginal bleeding. The current episode started yesterday. The onset of the illness was gradual. The problem has not changed since onset. The abdominal pain is located in the suprapubic region. The abdominal pain does not radiate.  The vaginal discharge was first noticed yesterday.  Symptoms associated with the illness do not include chills, constipation or hematuria.  pt c/o vaginal discharge, suprapubic pain. No known std exposure. No fever or chills. No nvd. Has iud, lnmp a few yrs ago per pt. Gyn dr Elmore Guise.  Past Medical History  Diagnosis Date  . Arthritis   . Bipolar 1 disorder   . Anxiety     Past Surgical History  Procedure Date  . Tonsillectomy     No family history on file.  History  Substance Use Topics  . Smoking status: Current Everyday Smoker -- 0.5 packs/day  . Smokeless tobacco: Not on file  . Alcohol Use: No    OB History    Grav Para Term Preterm Abortions TAB SAB Ect Mult Living                  Review of Systems  Constitutional: Negative for fever and chills.  Gastrointestinal: Positive for abdominal pain. Negative for nausea, vomiting, diarrhea and constipation.  Genitourinary: Positive for vaginal discharge. Negative for hematuria and vaginal bleeding.  All other systems reviewed and are negative.    Physical Exam  BP 124/71  Pulse 106  Temp(Src) 98.4 F (36.9 C) (Oral)  Resp 18  Ht 5\' 3"  (1.6 m)  Wt 162 lb (73.483 kg)  BMI 28.70 kg/m2  SpO2 100%  Physical Exam  Nursing note and vitals reviewed. Constitutional: She appears well-developed and well-nourished. No distress.  Eyes:  Conjunctivae are normal. No scleral icterus.  Neck: Neck supple. No tracheal deviation present.  Cardiovascular: Normal rate.   Pulmonary/Chest: Effort normal. No respiratory distress.  Abdominal: Soft. Normal appearance and bowel sounds are normal. She exhibits no distension and no mass. There is no tenderness. There is no rebound and no guarding.  Genitourinary:       No cva tenderness. Mild brownish vaginal discharge. +cmt. No focal adx masses/tenderness  Musculoskeletal: She exhibits no edema.  Neurological: She is alert.  Skin: Skin is warm and dry. No rash noted.  Psychiatric: She has a normal mood and affect.    ED Course  Procedures  MDM  Pt indicates has ride, did not drive. vicodin po.  Recheck abd  Soft nt. Discussed w pt, gc/chlamydia pending. Will rx cipro and flagyl.      Suzi Roots, MD 08/29/10 1713  Suzi Roots, MD 08/29/10 [phone]

## 2010-08-30 LAB — GC/CHLAMYDIA PROBE AMP, GENITAL: GC Probe Amp, Genital: NEGATIVE

## 2010-12-20 ENCOUNTER — Encounter (HOSPITAL_BASED_OUTPATIENT_CLINIC_OR_DEPARTMENT_OTHER): Payer: Self-pay | Admitting: Emergency Medicine

## 2010-12-20 ENCOUNTER — Emergency Department (HOSPITAL_BASED_OUTPATIENT_CLINIC_OR_DEPARTMENT_OTHER)
Admission: EM | Admit: 2010-12-20 | Discharge: 2010-12-20 | Disposition: A | Discharge status: Home / Self Care | Payer: Medicare Other | Attending: Emergency Medicine | Admitting: Emergency Medicine

## 2010-12-20 DIAGNOSIS — K0889 Other specified disorders of teeth and supporting structures: Secondary | ICD-10-CM

## 2010-12-20 DIAGNOSIS — Z79899 Other long term (current) drug therapy: Secondary | ICD-10-CM | POA: Insufficient documentation

## 2010-12-20 DIAGNOSIS — F17200 Nicotine dependence, unspecified, uncomplicated: Secondary | ICD-10-CM | POA: Insufficient documentation

## 2010-12-20 DIAGNOSIS — K089 Disorder of teeth and supporting structures, unspecified: Secondary | ICD-10-CM | POA: Insufficient documentation

## 2010-12-20 DIAGNOSIS — Z8739 Personal history of other diseases of the musculoskeletal system and connective tissue: Secondary | ICD-10-CM | POA: Insufficient documentation

## 2010-12-20 MED ORDER — HYDROCODONE-ACETAMINOPHEN 5-325 MG PO TABS: 2.0000 | ORAL_TABLET | Freq: Four times a day (QID) | ORAL | Status: AC | PRN

## 2010-12-20 MED ORDER — HYDROCODONE-ACETAMINOPHEN 5-325 MG PO TABS
2.0000 | ORAL_TABLET | Freq: Once | ORAL | Status: AC
  Administered 2010-12-20: 2 via ORAL
  Filled 2010-12-20: qty 2

## 2010-12-20 NOTE — ED Notes (Signed)
Pt reports L lower jaw pain 2 days post extraction unresponsive to Pain meds

## 2010-12-21 NOTE — ED Provider Notes (Signed)
History     CSN: [id number] Arrival date & time: 12/20/2010  9:27 PM   First MD Initiated Contact with Patient 12/20/10 2224      Chief Complaint  Patient presents with  . Dental Pain    Pt reports persistant Lower L jaw pain 2 day post dental extraction    (Consider location/radiation/quality/duration/timing/severity/associated sxs/prior treatment) HPI The patient is a 25 year old female who presents today complaining of pain since dental extraction of one of her left mandibular molars 2 days ago. She was given Vicodin but has lately 3 of these left. She has tried to get a followup appointment with her dentist but that she cannot be seen until Tuesday. She denies any nausea or vomiting. Patient denies fevers as well. She does have 10 out of 10 pain with this. Patient denies any facial swelling, difficulty breathing, or difficulty swallowing. Her pain is made worse with eating. Nothing makes it better. There are no other associated or modifying factors. When asked patient does note that the dental extraction was reportedly difficult to perform. Past Medical History  Diagnosis Date  . Arthritis   . Bipolar 1 disorder   . Anxiety     Past Surgical History  Procedure Date  . Tonsillectomy     History reviewed. No pertinent family history.  History  Substance Use Topics  . Smoking status: Current Everyday Smoker -- 0.5 packs/day  . Smokeless tobacco: Not on file  . Alcohol Use: No    OB History    Grav Para Term Preterm Abortions TAB SAB Ect Mult Living                  Review of Systems  HENT: Positive for dental problem.   All other systems reviewed and are negative.    Allergies  Augmentin and Codeine  Home Medications   Current Outpatient Rx  Name Route Sig Dispense Refill  . ALBUTEROL SULFATE HFA 108 (90 BASE) MCG/ACT IN AERS Inhalation Inhale 2 puffs into the lungs every 6 (six) hours as needed. For shortness of breath or wheezing     . CLONAZEPAM 1 MG PO  TABS Oral Take 1 mg by mouth 3 (three) times daily as needed. For anxiety     . HYDROCODONE-ACETAMINOPHEN 7.5-500 MG PO TABS Oral Take 2 tablets by mouth every 6 (six) hours as needed. For pain     . IBUPROFEN 800 MG PO TABS Oral Take 800 mg by mouth every 8 (eight) hours as needed. For pain     . SERTRALINE HCL 100 MG PO TABS Oral Take 100 mg by mouth daily.     Marland Kitchen HYDROCODONE-ACETAMINOPHEN 5-325 MG PO TABS Oral Take 2 tablets by mouth every 6 (six) hours as needed for pain. 30 tablet 0  . LEVONORGESTREL 20 MCG/24HR IU IUD Intrauterine 1 each by Intrauterine route once. Inserted May 2008       BP 128/80  Pulse 110  Temp 98.3 F (36.8 C)  Resp 22  SpO2 98%  Physical Exam  Nursing note and vitals reviewed. Constitutional: She is oriented to person, place, and time. She appears well-developed and well-nourished. No distress.       Uncomfortable  HENT:  Head: Normocephalic and atraumatic.  Mouth/Throat:    Eyes: Conjunctivae and EOM are normal. Pupils are equal, round, and reactive to light.  Neck: Normal range of motion.  Musculoskeletal: Normal range of motion.  Neurological: She is alert and oriented to person, place, and time. No cranial nerve  deficit. She exhibits normal muscle tone. Coordination normal.  Skin: Skin is warm and dry. No rash noted.  Psychiatric: She has a normal mood and affect.    ED Course  Procedures (including critical care time)  Labs Reviewed - No data to display No results found.   1. Pain, dental       MDM  Patient was evaluated and had no signs of new infection. I was comfortable writing her a prescription for Vicodin as she was about to run out of these and was unable to followup with her dentist until Tuesday. Patient was also advised to use ice given that she had reported the skull extraction. Patient was given precautions for reasons to return and discharged home in good condition.        Cyndra Numbers, MD 12/21/10 [phone]

## 2011-01-15 ENCOUNTER — Encounter (HOSPITAL_BASED_OUTPATIENT_CLINIC_OR_DEPARTMENT_OTHER): Payer: Self-pay

## 2011-01-15 ENCOUNTER — Emergency Department (HOSPITAL_BASED_OUTPATIENT_CLINIC_OR_DEPARTMENT_OTHER)
Admission: EM | Admit: 2011-01-15 | Discharge: 2011-01-15 | Disposition: A | Discharge status: Home / Self Care | Payer: Medicare Other | Attending: Emergency Medicine | Admitting: Emergency Medicine

## 2011-01-15 DIAGNOSIS — K1370 Unspecified lesions of oral mucosa: Secondary | ICD-10-CM | POA: Insufficient documentation

## 2011-01-15 DIAGNOSIS — F17200 Nicotine dependence, unspecified, uncomplicated: Secondary | ICD-10-CM | POA: Insufficient documentation

## 2011-01-15 DIAGNOSIS — K1379 Other lesions of oral mucosa: Secondary | ICD-10-CM

## 2011-01-15 DIAGNOSIS — F319 Bipolar disorder, unspecified: Secondary | ICD-10-CM | POA: Insufficient documentation

## 2011-01-15 MED ORDER — HYDROCODONE-ACETAMINOPHEN 5-325 MG PO TABS: 2.0000 | ORAL_TABLET | ORAL | Status: AC | PRN

## 2011-01-15 MED ORDER — HYDROCODONE-ACETAMINOPHEN 5-325 MG PO TABS
2.0000 | ORAL_TABLET | Freq: Once | ORAL | Status: AC
  Administered 2011-01-15: 2 via ORAL
  Filled 2011-01-15: qty 2

## 2011-01-15 NOTE — ED Provider Notes (Signed)
History     CSN: [id number] Arrival date & time: 01/15/2011  7:05 PM   First MD Initiated Contact with Patient 01/15/11 1949      Chief Complaint  Patient presents with  . Dental Pain    (Consider location/radiation/quality/duration/timing/severity/associated sxs/prior treatment) Patient is a 25 y.o. female presenting with tooth pain. The history is provided by the patient. No language interpreter was used.  Dental PainThe primary symptoms include mouth pain. The symptoms began 5 to 7 days ago. The symptoms are worsening. The symptoms are new. The symptoms occur constantly.  Additional symptoms include: gum tenderness.  Pt complains of having teeth extracted.  Past Medical History  Diagnosis Date  . Bipolar 1 disorder   . Anxiety   . Asthma   . Bipolar 1 disorder   . Anxiety     Past Surgical History  Procedure Date  . Tonsillectomy     No family history on file.  History  Substance Use Topics  . Smoking status: Current Everyday Smoker -- 0.5 packs/day  . Smokeless tobacco: Not on file  . Alcohol Use: No    OB History    Grav Para Term Preterm Abortions TAB SAB Ect Mult Living                  Review of Systems  HENT: Positive for dental problem.   All other systems reviewed and are negative.    Allergies  Augmentin and Codeine  Home Medications   Current Outpatient Rx  Name Route Sig Dispense Refill  . ALBUTEROL SULFATE HFA 108 (90 BASE) MCG/ACT IN AERS Inhalation Inhale 2 puffs into the lungs every 6 (six) hours as needed. For shortness of breath or wheezing     . CLONAZEPAM 1 MG PO TABS Oral Take 1 mg by mouth 3 (three) times daily as needed. For anxiety     . IBUPROFEN 800 MG PO TABS Oral Take 800 mg by mouth every 8 (eight) hours as needed. For pain     . SERTRALINE HCL 100 MG PO TABS Oral Take 100 mg by mouth daily.     Marland Kitchen HYDROCODONE-ACETAMINOPHEN 7.5-500 MG PO TABS Oral Take 2 tablets by mouth every 6 (six) hours as needed. For pain     .  LEVONORGESTREL 20 MCG/24HR IU IUD Intrauterine 1 each by Intrauterine route once. Inserted May 2008       BP 118/79  Pulse 85  Temp(Src) 98.6 F (37 C) (Oral)  Resp 16  Ht 5\' 3"  (1.6 m)  Wt 173 lb (78.472 kg)  BMI 30.65 kg/m2  SpO2 99%  LMP 01/15/2011  Physical Exam  Nursing note and vitals reviewed. Constitutional: She is oriented to person, place, and time. She appears well-developed and well-nourished.  HENT:  Head: Normocephalic and atraumatic.  Right Ear: External ear normal.  Mouth/Throat: Oropharynx is clear and moist.  Eyes: Pupils are equal, round, and reactive to light.  Neck: Normal range of motion.  Cardiovascular: Normal rate and normal heart sounds.   Pulmonary/Chest: Effort normal.  Abdominal: Soft.  Musculoskeletal: Normal range of motion.  Neurological: She is alert and oriented to person, place, and time.  Skin: Skin is warm.  Psychiatric: She has a normal mood and affect.    ED Course  Procedures (including critical care time)  Labs Reviewed - No data to display No results found.   No diagnosis found.    MDM  Pt advised to call her dentist.  Pt to continue clindamycin  Langston Masker, [place] 01/15/11 2016

## 2011-01-15 NOTE — ED Notes (Signed)
Pt reports a dental extraction one week ago and reports she still has pain.

## 2011-01-16 NOTE — ED Provider Notes (Signed)
Medical screening examination/treatment/procedure(s) were performed by non-physician practitioner and as supervising physician I was immediately available for consultation/collaboration.   Breya Cass A Phat Dalton, MD 01/16/11 0041 

## 2011-01-26 ENCOUNTER — Encounter (HOSPITAL_BASED_OUTPATIENT_CLINIC_OR_DEPARTMENT_OTHER): Payer: Self-pay | Admitting: *Deleted

## 2011-01-26 DIAGNOSIS — Z79899 Other long term (current) drug therapy: Secondary | ICD-10-CM | POA: Insufficient documentation

## 2011-01-26 DIAGNOSIS — F319 Bipolar disorder, unspecified: Secondary | ICD-10-CM | POA: Insufficient documentation

## 2011-01-26 DIAGNOSIS — K029 Dental caries, unspecified: Secondary | ICD-10-CM | POA: Insufficient documentation

## 2011-01-26 DIAGNOSIS — K1370 Unspecified lesions of oral mucosa: Secondary | ICD-10-CM | POA: Insufficient documentation

## 2011-01-26 DIAGNOSIS — F17200 Nicotine dependence, unspecified, uncomplicated: Secondary | ICD-10-CM | POA: Insufficient documentation

## 2011-01-26 DIAGNOSIS — J45909 Unspecified asthma, uncomplicated: Secondary | ICD-10-CM | POA: Insufficient documentation

## 2011-01-26 NOTE — ED Notes (Signed)
Pt states she had a tooth pullled 1 mth ago "but they left a little piece in there". Came here and was sent to dentist for f/u. Saw dentist on the 20th, but unable to do xray d/t pain. Given antibx and Ibuprofen 800 mg.  Using Orajel as well without relief.

## 2011-01-27 ENCOUNTER — Emergency Department (HOSPITAL_BASED_OUTPATIENT_CLINIC_OR_DEPARTMENT_OTHER)
Admission: EM | Admit: 2011-01-27 | Discharge: 2011-01-27 | Disposition: A | Discharge status: Home / Self Care | Payer: Medicare Other | Attending: Emergency Medicine | Admitting: Emergency Medicine

## 2011-01-27 DIAGNOSIS — K121 Other forms of stomatitis: Secondary | ICD-10-CM

## 2011-01-27 MED ORDER — CLINDAMYCIN HCL 300 MG PO CAPS: 300.0000 mg | ORAL_CAPSULE | Freq: Four times a day (QID) | ORAL | Status: AC

## 2011-01-27 MED ORDER — HYDROCODONE-ACETAMINOPHEN 5-325 MG PO TABS
1.0000 | ORAL_TABLET | Freq: Once | ORAL | Status: AC
  Administered 2011-01-27: 1 via ORAL
  Filled 2011-01-27: qty 1

## 2011-01-27 MED ORDER — HYDROCODONE-ACETAMINOPHEN 5-500 MG PO TABS: 2.0000 | ORAL_TABLET | Freq: Four times a day (QID) | ORAL | Status: AC | PRN

## 2011-01-27 NOTE — ED Provider Notes (Signed)
History     CSN: [id number] Arrival date & time: 01/27/2011 12:05 AM   First MD Initiated Contact with Patient 01/27/11 0010      Chief Complaint  Patient presents with  . Dental Pain    (Consider location/radiation/quality/duration/timing/severity/associated sxs/prior treatment) Patient is a 25 y.o. female presenting with tooth pain. The history is provided by the patient. No language interpreter was used.  Dental PainThe primary symptoms include dental injury and oral lesions. Primary symptoms do not include mouth pain, oral bleeding, headaches, fever, shortness of breath, sore throat, angioedema or cough. The symptoms are worsening. The symptoms are recurrent. The symptoms occur constantly.  Additional symptoms include: dental sensitivity to temperature and gum tenderness. Additional symptoms do not include: gum swelling, purulent gums, trismus, jaw pain, facial swelling, trouble swallowing, pain with swallowing, excessive salivation, dry mouth, taste disturbance, drooling, ear pain, hearing loss, nosebleeds, swollen glands, goiter and fatigue. Medical issues include: smoking. Medical issues do not include: cancer.    Past Medical History  Diagnosis Date  . Bipolar 1 disorder   . Anxiety   . Asthma   . Bipolar 1 disorder   . Anxiety     Past Surgical History  Procedure Date  . Tonsillectomy     History reviewed. No pertinent family history.  History  Substance Use Topics  . Smoking status: Current Everyday Smoker -- 0.5 packs/day  . Smokeless tobacco: Not on file  . Alcohol Use: No    OB History    Grav Para Term Preterm Abortions TAB SAB Ect Mult Living                  Review of Systems  Constitutional: Negative for fever and fatigue.  HENT: Negative for hearing loss, ear pain, nosebleeds, sore throat, facial swelling, drooling and trouble swallowing.   Respiratory: Negative for cough and shortness of breath.   Genitourinary: Negative for difficulty  urinating.  Musculoskeletal: Negative.   Neurological: Negative for headaches.  Hematological: Negative.   Psychiatric/Behavioral: Negative.     Allergies  Augmentin and Codeine  Home Medications   Current Outpatient Rx  Name Route Sig Dispense Refill  . CLONAZEPAM 1 MG PO TABS Oral Take 1 mg by mouth 3 (three) times daily as needed. For anxiety     . IBUPROFEN 800 MG PO TABS Oral Take 800 mg by mouth every 8 (eight) hours as needed. For pain     . PRESCRIPTION MEDICATION Oral Take 1 capsule by mouth 3 (three) times daily. antibiotic     . SERTRALINE HCL 100 MG PO TABS Oral Take 100 mg by mouth daily.     . ALBUTEROL SULFATE HFA 108 (90 BASE) MCG/ACT IN AERS Inhalation Inhale 2 puffs into the lungs every 6 (six) hours as needed. For shortness of breath or wheezing     . CLINDAMYCIN HCL 300 MG PO CAPS Oral Take 1 capsule (300 mg total) by mouth every 6 (six) hours. 28 capsule 0  . HYDROCODONE-ACETAMINOPHEN 5-500 MG PO TABS Oral Take 2 tablets by mouth every 6 (six) hours as needed for pain.  10 tablet 0  . LEVONORGESTREL 20 MCG/24HR IU IUD Intrauterine 1 each by Intrauterine route once. Inserted May 2008       BP 127/82  Pulse 118  Temp(Src) 98.8 F (37.1 C) (Oral)  Resp 20  Ht 5\' 3"  (1.6 m)  Wt 172 lb (78.019 kg)  BMI 30.47 kg/m2  SpO2 100%  LMP 01/15/2011  Physical Exam  Constitutional:  She is oriented to person, place, and time. She appears well-developed and well-nourished. No distress.  HENT:  Head: Normocephalic and atraumatic.  Mouth/Throat: Oral lesions present. Abnormal dentition. Dental caries present. No oropharyngeal exudate.    Eyes: EOM are normal. Pupils are equal, round, and reactive to light.  Neck: Normal range of motion. Neck supple.  Cardiovascular: Normal rate and regular rhythm.   Pulmonary/Chest: Effort normal and breath sounds normal.  Abdominal: Soft. Bowel sounds are normal. There is no tenderness. There is no rebound.  Musculoskeletal: Normal  range of motion. She exhibits no edema.  Neurological: She is alert and oriented to person, place, and time.  Skin: Skin is warm.  Psychiatric: Thought content normal.    ED Course  Procedures (including critical care time)  Labs Reviewed - No data to display No results found.   1. Dental caries   2. Oral ulcer       MDM  Follow up in 24 hours with your dentist for panorex and continued care take all abx.  Patient verbalizes understanding and agrees to follow up        Soniya Ashraf Smitty Cords, MD 01/27/11 0028

## 2011-06-23 ENCOUNTER — Emergency Department (HOSPITAL_BASED_OUTPATIENT_CLINIC_OR_DEPARTMENT_OTHER)
Admission: EM | Admit: 2011-06-23 | Discharge: 2011-06-23 | Disposition: A | Discharge status: Home / Self Care | Payer: Medicare Other | Attending: Emergency Medicine | Admitting: Emergency Medicine

## 2011-06-23 ENCOUNTER — Emergency Department (HOSPITAL_BASED_OUTPATIENT_CLINIC_OR_DEPARTMENT_OTHER): Payer: Medicare Other

## 2011-06-23 ENCOUNTER — Encounter (HOSPITAL_BASED_OUTPATIENT_CLINIC_OR_DEPARTMENT_OTHER): Payer: Self-pay | Admitting: *Deleted

## 2011-06-23 DIAGNOSIS — R1011 Right upper quadrant pain: Secondary | ICD-10-CM | POA: Insufficient documentation

## 2011-06-23 DIAGNOSIS — R112 Nausea with vomiting, unspecified: Secondary | ICD-10-CM | POA: Insufficient documentation

## 2011-06-23 DIAGNOSIS — J45909 Unspecified asthma, uncomplicated: Secondary | ICD-10-CM | POA: Insufficient documentation

## 2011-06-23 DIAGNOSIS — F411 Generalized anxiety disorder: Secondary | ICD-10-CM | POA: Insufficient documentation

## 2011-06-23 DIAGNOSIS — Z975 Presence of (intrauterine) contraceptive device: Secondary | ICD-10-CM | POA: Insufficient documentation

## 2011-06-23 DIAGNOSIS — R05 Cough: Secondary | ICD-10-CM | POA: Insufficient documentation

## 2011-06-23 DIAGNOSIS — F319 Bipolar disorder, unspecified: Secondary | ICD-10-CM | POA: Insufficient documentation

## 2011-06-23 DIAGNOSIS — R059 Cough, unspecified: Secondary | ICD-10-CM | POA: Insufficient documentation

## 2011-06-23 DIAGNOSIS — R10811 Right upper quadrant abdominal tenderness: Secondary | ICD-10-CM | POA: Insufficient documentation

## 2011-06-23 LAB — COMPREHENSIVE METABOLIC PANEL
AST: 13 U/L (ref 0–37)
CO2: 25 mEq/L (ref 19–32)
Calcium: 9.6 mg/dL (ref 8.4–10.5)
Creatinine, Ser: 0.7 mg/dL (ref 0.50–1.10)
GFR calc Af Amer: >90 mL/min (ref >90)
GFR calc non Af Amer: >90 mL/min (ref >90)

## 2011-06-23 LAB — DIFFERENTIAL
Basophils Absolute: 0 10*3/uL (ref 0.0–0.1)
Eosinophils Absolute: 0 10*3/uL (ref 0.0–0.7)
Eosinophils Relative: 0 % (ref 0–5)
Lymphocytes Relative: 22 % (ref 12–46)
Monocytes Absolute: 0.5 10*3/uL (ref 0.1–1.0)

## 2011-06-23 LAB — CBC
HCT: 37.8 % (ref 36.0–46.0)
MCH: 33.7 pg (ref 26.0–34.0)
MCHC: 35.2 g/dL (ref 30.0–36.0)
MCV: 95.7 fL (ref 78.0–100.0)
RDW: 12.5 % (ref 11.5–15.5)
WBC: 8.6 10*3/uL (ref 4.0–10.5)

## 2011-06-23 LAB — URINALYSIS, ROUTINE W REFLEX MICROSCOPIC
Bilirubin Urine: NEGATIVE
Glucose, UA: NEGATIVE mg/dL
Hgb urine dipstick: NEGATIVE
Ketones, ur: NEGATIVE mg/dL
Protein, ur: NEGATIVE mg/dL

## 2011-06-23 MED ORDER — SODIUM CHLORIDE 0.9 % IV BOLUS (SEPSIS)
1000.0000 mL | Freq: Once | INTRAVENOUS | Status: AC
  Administered 2011-06-23: 1000 mL via INTRAVENOUS

## 2011-06-23 MED ORDER — ONDANSETRON HCL 4 MG/2ML IJ SOLN
4.0000 mg | Freq: Once | INTRAMUSCULAR | Status: AC
  Administered 2011-06-23: 4 mg via INTRAVENOUS
  Filled 2011-06-23: qty 2

## 2011-06-23 MED ORDER — HYDROMORPHONE HCL PF 1 MG/ML IJ SOLN
1.0000 mg | Freq: Once | INTRAMUSCULAR | Status: AC
  Administered 2011-06-23: 1 mg via INTRAVENOUS
  Filled 2011-06-23: qty 1

## 2011-06-23 MED ORDER — OXYCODONE-ACETAMINOPHEN 5-325 MG PO TABS: 1.0000 | ORAL_TABLET | ORAL | Status: AC | PRN

## 2011-06-23 MED ORDER — ONDANSETRON 8 MG PO TBDP: 8.0000 mg | ORAL_TABLET | Freq: Three times a day (TID) | ORAL | Status: AC | PRN

## 2011-06-23 NOTE — ED Notes (Signed)
Pt c/o RUQ pain since last p.m. Vomited x 4-5 today.

## 2011-06-23 NOTE — Discharge Instructions (Signed)
Abdominal Pain (Nonspecific) Your exam might not show the exact reason you have abdominal pain. Since there are many different causes of abdominal pain, another checkup and more tests may be needed. It is very important to follow up for lasting (persistent) or worsening symptoms. A possible cause of abdominal pain in any person who still has his or her appendix is acute appendicitis. Appendicitis is often hard to diagnose. Normal blood tests, urine tests, ultrasound, and CT scans do not completely rule out early appendicitis or other causes of abdominal pain. Sometimes, only the changes that happen over time will allow appendicitis and other causes of abdominal pain to be determined. Other potential problems that may require surgery may also take time to become more apparent. Because of this, it is important that you follow all of the instructions below. HOME CARE INSTRUCTIONS   Rest as much as possible.   Do not eat solid food until your pain is gone.   While adults or children have pain: A diet of water, weak decaffeinated tea, broth or bouillon, gelatin, oral rehydration solutions (ORS), frozen ice pops, or ice chips may be helpful.   When pain is gone in adults or children: Start a light diet (dry toast, crackers, applesauce, or white rice). Increase the diet slowly as long as it does not bother you. Eat no dairy products (including cheese and eggs) and no spicy, fatty, fried, or high-fiber foods.   Use no alcohol, caffeine, or cigarettes.   Take your regular medicines unless your caregiver told you not to.   Take any prescribed medicine as directed.   Only take over-the-counter or prescription medicines for pain, discomfort, or fever as directed by your caregiver. Do not give aspirin to children.  If your caregiver has given you a follow-up appointment, it is very important to keep that appointment. Not keeping the appointment could result in a permanent injury and/or lasting (chronic) pain  and/or disability. If there is any problem keeping the appointment, you must call to reschedule.  SEEK IMMEDIATE MEDICAL CARE IF:   Your pain is not gone in 24 hours.   Your pain becomes worse, changes location, or feels different.   You or your child has an oral temperature above 102 F (38.9 C), not controlled by medicine.   Your baby is older than 3 months with a rectal temperature of 102 F (38.9 C) or higher.   Your baby is 19 months old or younger with a rectal temperature of 100.4 F (38 C) or higher.   You have shaking chills.   You keep throwing up (vomiting) or cannot drink liquids.   There is blood in your vomit or you see blood in your bowel movements.   Your bowel movements become dark or black.   You have frequent bowel movements.   Your bowel movements stop (become blocked) or you cannot pass gas.   You have bloody, frequent, or painful urination.   You have yellow discoloration in the skin or whites of the eyes.   Your stomach becomes bloated or bigger.   You have dizziness or fainting.   You have chest or back pain.  MAKE SURE YOU:   Understand these instructions.   Will watch your condition.   Will get help right away if you are not doing well or get worse.  Document Released: 01/28/2005 Document Revised: 01/17/2011 Document Reviewed: 12/26/2008 [facility] Patient Information 2012 [place], [place].  Please use pain and nausea medicine as needed tonight. Please return for  ultrasound in the morning as discussed.

## 2011-06-23 NOTE — ED Provider Notes (Addendum)
History   This chart was scribed for Kimberly Quarry, MD by Charolett Bumpers . The patient was seen in room MH09/MH09.    CSN: [id number]  Arrival date & time 06/23/11  6213   First MD Initiated Contact with Patient 06/23/11 1950      Chief Complaint  Patient presents with  . Abdominal Pain    (Consider location/radiation/quality/duration/timing/severity/associated sxs/prior treatment) HPI [name] is a 26 y.o. female who presents to the Emergency Department complaining of constant, severe RUQ abdominal pain for the past 24 hours. Patient describes abdominal pain as cramping and rates it an 8/10. Patient states that her abdominal pain radiates within the RUQ. Patient states that her symptoms are made worse with eating and relieved by nothing. Patient reports associated n/v and states that she has vomited 4-5 times since onset of symptoms. Patient denies being pregnant and states that she has a IUD in place. Patient denies any pertinent medical hx. Patient states she is a smoker. Patient states she also has a cough. Patient states that her only abdominal surgery was when she had a kidney stone removed.   Past Medical History  Diagnosis Date  . Bipolar 1 disorder   . Anxiety   . Asthma   . Bipolar 1 disorder   . Anxiety     Past Surgical History  Procedure Date  . Tonsillectomy     History reviewed. No pertinent family history.  History  Substance Use Topics  . Smoking status: Current Everyday Smoker -- 0.5 packs/day  . Smokeless tobacco: Not on file  . Alcohol Use: No    OB History    Grav Para Term Preterm Abortions TAB SAB Ect Mult Living                  Review of Systems  Constitutional: Negative for fever.  Respiratory: Positive for cough.   Gastrointestinal: Positive for nausea, vomiting and abdominal pain. Negative for diarrhea.  Genitourinary: Negative for dysuria, urgency, frequency and vaginal discharge.  All other systems reviewed and are  negative.    Allergies  Amoxicillin-pot clavulanate and Codeine  Home Medications   Current Outpatient Rx  Name Route Sig Dispense Refill  . ALBUTEROL SULFATE HFA 108 (90 BASE) MCG/ACT IN AERS Inhalation Inhale 2 puffs into the lungs every 6 (six) hours as needed. For shortness of breath or wheezing     . LEVONORGESTREL 20 MCG/24HR IU IUD Intrauterine 1 each by Intrauterine route once. Inserted May 2008       BP 124/73  Pulse 93  Temp(Src) 98.2 F (36.8 C) (Oral)  Resp 16  Ht 5\' 3"  (1.6 m)  Wt 173 lb (78.472 kg)  BMI 30.65 kg/m2  SpO2 100%  LMP 08/07/2005  Physical Exam  Nursing note and vitals reviewed. Constitutional: She is oriented to person, place, and time. She appears well-developed and well-nourished. No distress.  HENT:  Head: Normocephalic and atraumatic.  Right Ear: External ear normal.  Left Ear: External ear normal.  Nose: Nose normal.  Mouth/Throat: Oropharynx is clear and moist.  Eyes: EOM are normal. Pupils are equal, round, and reactive to light.  Neck: Normal range of motion. Neck supple. No tracheal deviation present.  Cardiovascular: Normal rate, regular rhythm and normal heart sounds.   Pulmonary/Chest: Effort normal and breath sounds normal. No respiratory distress.  Abdominal: Soft. Bowel sounds are normal. She exhibits no distension. There is tenderness (RUQ). There is no rebound and no guarding.  Musculoskeletal: Normal range  of motion. She exhibits no edema.  Neurological: She is alert and oriented to person, place, and time. No sensory deficit.  Skin: Skin is warm and dry.  Psychiatric: She has a normal mood and affect. Her behavior is normal.    ED Course  Procedures (including critical care time)  DIAGNOSTIC STUDIES: Oxygen Saturation is 100% on room air, normal by my interpretation.    COORDINATION OF CARE:  2003: Discussed planned course of treatment with the patient who is agreeable at this time.  2015: Medication Orders: Sodium  chloride 0.9% bolus 1,000 mL-once; Ondansetron (Zofran) injection 4 mg-once; Hydromorphone (Dilaudid) injection 1 mg-once.   Results for orders placed during the hospital encounter of 06/23/11  PREGNANCY, URINE      Component Value Range   Preg Test, Ur NEGATIVE  NEGATIVE   URINALYSIS, ROUTINE W REFLEX MICROSCOPIC      Component Value Range   Color, Urine YELLOW  YELLOW    APPearance CLOUDY (*) CLEAR    Specific Gravity, Urine 1.030  1.005 - 1.030    pH 6.5  5.0 - 8.0    Glucose, UA NEGATIVE  NEGATIVE (mg/dL)   Hgb urine dipstick NEGATIVE  NEGATIVE    Bilirubin Urine NEGATIVE  NEGATIVE    Ketones, ur NEGATIVE  NEGATIVE (mg/dL)   Protein, ur NEGATIVE  NEGATIVE (mg/dL)   Urobilinogen, UA 1.0  0.0 - 1.0 (mg/dL)   Nitrite NEGATIVE  NEGATIVE    Leukocytes, UA NEGATIVE  NEGATIVE   CBC      Component Value Range   WBC 8.6  4.0 - 10.5 (K/uL)   RBC 3.95  3.87 - 5.11 (MIL/uL)   Hemoglobin 13.3  12.0 - 15.0 (g/dL)   HCT 37.9  02.4 - 09.7 (%)   MCV 95.7  78.0 - 100.0 (fL)   MCH 33.7  26.0 - 34.0 (pg)   MCHC 35.2  30.0 - 36.0 (g/dL)   RDW 35.3  29.9 - 24.2 (%)   Platelets 256  150 - 400 (K/uL)  DIFFERENTIAL      Component Value Range   Neutrophils Relative 72  43 - 77 (%)   Neutro Abs 6.2  1.7 - 7.7 (K/uL)   Lymphocytes Relative 22  12 - 46 (%)   Lymphs Abs 1.9  0.7 - 4.0 (K/uL)   Monocytes Relative 6  3 - 12 (%)   Monocytes Absolute 0.5  0.1 - 1.0 (K/uL)   Eosinophils Relative 0  0 - 5 (%)   Eosinophils Absolute 0.0  0.0 - 0.7 (K/uL)   Basophils Relative 0  0 - 1 (%)   Basophils Absolute 0.0  0.0 - 0.1 (K/uL)  COMPREHENSIVE METABOLIC PANEL      Component Value Range   Sodium 141  135 - 145 (mEq/L)   Potassium 3.6  3.5 - 5.1 (mEq/L)   Chloride 107  96 - 112 (mEq/L)   CO2 25  19 - 32 (mEq/L)   Glucose, Bld 98  70 - 99 (mg/dL)   BUN 13  6 - 23 (mg/dL)   Creatinine, Ser 6.83  0.50 - 1.10 (mg/dL)   Calcium 9.6  8.4 - 41.9 (mg/dL)   Total Protein 7.5  6.0 - 8.3 (g/dL)   Albumin  4.4  3.5 - 5.2 (g/dL)   AST 13  0 - 37 (U/L)   ALT 10  0 - 35 (U/L)   Alkaline Phosphatase 66  39 - 117 (U/L)   Total Bilirubin 0.7  0.3 - 1.2 (  mg/dL)   GFR calc non Af Amer >90  >90 (mL/min)   GFR calc Af Amer >90  >90 (mL/min)  LIPASE, BLOOD      Component Value Range   Lipase 23  11 - 59 (U/L)     No results found.   No diagnosis found.    MDM  Patient improved after pain medicine. Repeat abdominal exam with minimal tenderness. Labs without any acute abnormalities. Plan outpatient ultrasound in morning. Plans discussed with patient she is in agreement.  Kimberly Quarry, MD 06/23/11 2251  I personally performed the services described in this documentation, which was scribed in my presence. The recorded information has been reviewed and considered.   Kimberly Quarry, MD 06/23/11 2251

## 2011-06-23 NOTE — ED Notes (Signed)
rx x 2 for zofran and percocet- pt verbalizes understanding to return tomorrow for ultrasound- pt has family to drive

## 2011-06-24 ENCOUNTER — Other Ambulatory Visit (HOSPITAL_BASED_OUTPATIENT_CLINIC_OR_DEPARTMENT_OTHER): Payer: Medicare Other

## 2011-06-25 ENCOUNTER — Other Ambulatory Visit (HOSPITAL_BASED_OUTPATIENT_CLINIC_OR_DEPARTMENT_OTHER): Payer: Medicare Other

## 2011-08-31 ENCOUNTER — Emergency Department (HOSPITAL_BASED_OUTPATIENT_CLINIC_OR_DEPARTMENT_OTHER)
Admission: EM | Admit: 2011-08-31 | Discharge: 2011-08-31 | Disposition: A | Discharge status: Home Health | Payer: Medicare Other | Attending: Emergency Medicine | Admitting: Emergency Medicine

## 2011-08-31 ENCOUNTER — Encounter (HOSPITAL_BASED_OUTPATIENT_CLINIC_OR_DEPARTMENT_OTHER): Payer: Self-pay | Admitting: *Deleted

## 2011-08-31 DIAGNOSIS — R102 Pelvic and perineal pain: Secondary | ICD-10-CM

## 2011-08-31 DIAGNOSIS — J45909 Unspecified asthma, uncomplicated: Secondary | ICD-10-CM | POA: Insufficient documentation

## 2011-08-31 DIAGNOSIS — N949 Unspecified condition associated with female genital organs and menstrual cycle: Secondary | ICD-10-CM | POA: Insufficient documentation

## 2011-08-31 DIAGNOSIS — F319 Bipolar disorder, unspecified: Secondary | ICD-10-CM | POA: Insufficient documentation

## 2011-08-31 DIAGNOSIS — F17200 Nicotine dependence, unspecified, uncomplicated: Secondary | ICD-10-CM | POA: Insufficient documentation

## 2011-08-31 LAB — PREGNANCY, URINE: Preg Test, Ur: NEGATIVE

## 2011-08-31 LAB — URINALYSIS, ROUTINE W REFLEX MICROSCOPIC
Glucose, UA: NEGATIVE mg/dL
Protein, ur: NEGATIVE mg/dL
Urobilinogen, UA: 1 mg/dL (ref 0.0–1.0)

## 2011-08-31 MED ORDER — PROMETHAZINE HCL 25 MG PO TABS: 25.0000 mg | ORAL_TABLET | Freq: Four times a day (QID) | ORAL | Status: DC | PRN

## 2011-08-31 MED ORDER — OXYCODONE-ACETAMINOPHEN 5-325 MG PO TABS
2.0000 | ORAL_TABLET | Freq: Once | ORAL | Status: AC
  Administered 2011-08-31: 2 via ORAL
  Filled 2011-08-31: qty 2

## 2011-08-31 MED ORDER — HYDROCODONE-ACETAMINOPHEN 5-325 MG PO TABS: 2.0000 | ORAL_TABLET | ORAL | Status: AC | PRN

## 2011-08-31 MED ORDER — ONDANSETRON 8 MG PO TBDP
8.0000 mg | ORAL_TABLET | Freq: Once | ORAL | Status: AC
  Administered 2011-08-31: 8 mg via ORAL
  Filled 2011-08-31: qty 1

## 2011-08-31 NOTE — ED Provider Notes (Signed)
History   This chart was scribed for Rolan Bucco, MD scribed by Magnus Sinning. The patient was seen in room MH06/MH06 seen at 18:00   CSN: [id number]  Arrival date & time 08/31/11  1623   First MD Initiated Contact with Patient 08/31/11 1758      Chief Complaint  Patient presents with  . Abdominal Pain    (Consider location/radiation/quality/duration/timing/severity/associated sxs/prior treatment) HPI AMEENAH PROSSER is a 26 y.o. female who presents to the Emergency Department complaining of constant moderate cramping abd pain with associated back pain, both onset 3 days. Also reports associated vaginal spotting, brown vaginal discharge and, n/v, onset 2 days. Patient had a LEEP procedure done 3 days ago and states she has been having the cramping abd and back pain since. She states she's taken ibuprofen with no relief and called her OB and was told to stay off her feet. Patient also states that she has been having mild cough.  Denies fevers, urinary sxs, similar history of sxs, cold, chest pain, trouble breathing, or rash. Past Medical History  Diagnosis Date  . Bipolar 1 disorder   . Anxiety   . Asthma   . Bipolar 1 disorder   . Anxiety     Past Surgical History  Procedure Date  . Tonsillectomy   . Cervical biopsy  w/ loop electrode excision     History reviewed. No pertinent family history.  History  Substance Use Topics  . Smoking status: Current Everyday Smoker -- 0.5 packs/day  . Smokeless tobacco: Not on file  . Alcohol Use: No   Review of Systems  Constitutional: Negative for fever, chills, diaphoresis and fatigue.  HENT: Negative for congestion, rhinorrhea and sneezing.   Eyes: Negative.   Respiratory: Positive for cough. Negative for chest tightness and shortness of breath.   Cardiovascular: Negative for chest pain and leg swelling.  Gastrointestinal: Positive for abdominal pain. Negative for nausea, vomiting, diarrhea and blood in stool.  Genitourinary:  Positive for vaginal bleeding and vaginal discharge. Negative for frequency, hematuria, flank pain and difficulty urinating.  Musculoskeletal: Positive for back pain. Negative for arthralgias.  Skin: Negative for rash.  Neurological: Negative for dizziness, speech difficulty, weakness, numbness and headaches.  All other systems reviewed and are negative.    Allergies  Amoxicillin-pot clavulanate and Codeine  Home Medications   Current Outpatient Rx  Name Route Sig Dispense Refill  . ALBUTEROL SULFATE HFA 108 (90 BASE) MCG/ACT IN AERS Inhalation Inhale 2 puffs into the lungs every 6 (six) hours as needed. For shortness of breath or wheezing     . HYDROCODONE-ACETAMINOPHEN 5-325 MG PO TABS Oral Take 2 tablets by mouth every 4 (four) hours as needed for pain. 15 tablet 0  . LEVONORGESTREL 20 MCG/24HR IU IUD Intrauterine 1 each by Intrauterine route once. Inserted May 2008     . PROMETHAZINE HCL 25 MG PO TABS Oral Take 1 tablet (25 mg total) by mouth every 6 (six) hours as needed for nausea. 15 tablet 0    BP 144/96  Pulse 90  Temp 98.4 F (36.9 C) (Oral)  Resp 20  Ht 5\' 3"  (1.6 m)  Wt 174 lb (78.926 kg)  BMI 30.82 kg/m2  SpO2 100%  Physical Exam  Nursing note and vitals reviewed. Constitutional: She is oriented to person, place, and time. She appears well-developed and well-nourished.  HENT:  Head: Normocephalic and atraumatic.  Eyes: Pupils are equal, round, and reactive to light.  Neck: Normal range of motion. Neck supple.  Cardiovascular: Normal rate, regular rhythm and normal heart sounds.   Pulmonary/Chest: Effort normal and breath sounds normal. No respiratory distress. She has no wheezes. She has no rales. She exhibits no tenderness.  Abdominal: Soft. Bowel sounds are normal. There is no tenderness. There is no rebound and no guarding.       Mild discomfort to suprapubic area  Genitourinary:       No bleeding.  Small amount of clear/mucus discharge.  No CMT/adnexal  tenderness  Musculoskeletal: Normal range of motion. She exhibits no edema.  Lymphadenopathy:    She has no cervical adenopathy.  Neurological: She is alert and oriented to person, place, and time.  Skin: Skin is warm and dry. No rash noted.  Psychiatric: She has a normal mood and affect.    ED Course  Procedures (including critical care time) DIAGNOSTIC STUDIES: Oxygen Saturation is 100% on room air, normal by my interpretation.    COORDINATION OF CARE: Results for orders placed during the hospital encounter of 08/31/11  URINALYSIS, ROUTINE W REFLEX MICROSCOPIC      Component Value Range   Color, Urine YELLOW  YELLOW   APPearance CLEAR  CLEAR   Specific Gravity, Urine 1.020  1.005 - 1.030   pH 6.5  5.0 - 8.0   Glucose, UA NEGATIVE  NEGATIVE mg/dL   Hgb urine dipstick MODERATE (*) NEGATIVE   Bilirubin Urine NEGATIVE  NEGATIVE   Ketones, ur NEGATIVE  NEGATIVE mg/dL   Protein, ur NEGATIVE  NEGATIVE mg/dL   Urobilinogen, UA 1.0  0.0 - 1.0 mg/dL   Nitrite NEGATIVE  NEGATIVE   Leukocytes, UA MODERATE (*) NEGATIVE  PREGNANCY, URINE      Component Value Range   Preg Test, Ur NEGATIVE  NEGATIVE  URINE MICROSCOPIC-ADD ON      Component Value Range   Squamous Epithelial / LPF FEW (*) RARE   WBC, UA 3-6  <3 WBC/hpf   RBC / HPF 0-2  <3 RBC/hpf   Bacteria, UA MANY (*) RARE   Urine-Other MUCOUS PRESENT     No results found.    1. Pelvic pain in female       MDM  Pelvic exam does not look consistent with infection.  No active bleeding.  Likely cramping related to procedure.  Pt smiling, in no apparent distress.  Will prescribe pain and nausea meds.  F/u with her gyn on Monday if symptoms not better I personally performed the services described in this documentation, which was scribed in my presence.  The recorded information has been reviewed and considered.          Rolan Bucco, MD 08/31/11 1840

## 2011-08-31 NOTE — ED Notes (Signed)
Pt had LEEP procedure on the 17th and has had abd and back pain since. Bloody d/c. +N/V x 2 days.

## 2011-10-28 ENCOUNTER — Emergency Department (HOSPITAL_BASED_OUTPATIENT_CLINIC_OR_DEPARTMENT_OTHER)
Admission: EM | Admit: 2011-10-28 | Discharge: 2011-10-29 | Disposition: A | Discharge status: Home / Self Care | Payer: Medicare Other | Attending: Emergency Medicine | Admitting: Emergency Medicine

## 2011-10-28 ENCOUNTER — Encounter (HOSPITAL_BASED_OUTPATIENT_CLINIC_OR_DEPARTMENT_OTHER): Payer: Self-pay | Admitting: *Deleted

## 2011-10-28 DIAGNOSIS — J069 Acute upper respiratory infection, unspecified: Secondary | ICD-10-CM | POA: Insufficient documentation

## 2011-10-28 DIAGNOSIS — H6690 Otitis media, unspecified, unspecified ear: Secondary | ICD-10-CM | POA: Insufficient documentation

## 2011-10-28 DIAGNOSIS — H6691 Otitis media, unspecified, right ear: Secondary | ICD-10-CM

## 2011-10-28 DIAGNOSIS — J45909 Unspecified asthma, uncomplicated: Secondary | ICD-10-CM | POA: Insufficient documentation

## 2011-10-28 DIAGNOSIS — H60399 Other infective otitis externa, unspecified ear: Secondary | ICD-10-CM | POA: Insufficient documentation

## 2011-10-28 DIAGNOSIS — F319 Bipolar disorder, unspecified: Secondary | ICD-10-CM | POA: Insufficient documentation

## 2011-10-28 DIAGNOSIS — J45901 Unspecified asthma with (acute) exacerbation: Secondary | ICD-10-CM | POA: Insufficient documentation

## 2011-10-28 DIAGNOSIS — F17200 Nicotine dependence, unspecified, uncomplicated: Secondary | ICD-10-CM | POA: Insufficient documentation

## 2011-10-28 DIAGNOSIS — H6091 Unspecified otitis externa, right ear: Secondary | ICD-10-CM

## 2011-10-28 DIAGNOSIS — F411 Generalized anxiety disorder: Secondary | ICD-10-CM | POA: Insufficient documentation

## 2011-10-28 NOTE — ED Notes (Signed)
Earache right ear and non productive cough. Son is being treated for an ear infection.

## 2011-10-28 NOTE — ED Notes (Signed)
Ear pain to right ear on Saturday, describes it as a popping feeling, reports history of asthma, used last puff of inhaler yesterday morning, non productive cough started Sunday afternoon, reports feeling as if she has had fever at home, however has not taken temp. +sick contacts

## 2011-10-29 ENCOUNTER — Telehealth (HOSPITAL_BASED_OUTPATIENT_CLINIC_OR_DEPARTMENT_OTHER): Payer: Self-pay | Admitting: *Deleted

## 2011-10-29 MED ORDER — NEOMYCIN-POLYMYXIN-HC 3.5-10000-1 OT SUSP: 4.0000 [drp] | Freq: Four times a day (QID) | OTIC | Status: DC

## 2011-10-29 MED ORDER — SULFAMETHOXAZOLE-TRIMETHOPRIM 800-160 MG PO TABS: 1.0000 | ORAL_TABLET | Freq: Two times a day (BID) | ORAL | Status: DC

## 2011-10-29 MED ORDER — ALBUTEROL SULFATE HFA 108 (90 BASE) MCG/ACT IN AERS
2.0000 | INHALATION_SPRAY | Freq: Once | RESPIRATORY_TRACT | Status: AC
  Administered 2011-10-29: 2 via RESPIRATORY_TRACT
  Filled 2011-10-29: qty 6.7

## 2011-10-29 MED ORDER — PREDNISONE 20 MG PO TABS: ORAL_TABLET | ORAL | Status: DC

## 2011-10-29 MED ORDER — PREDNISONE 50 MG PO TABS
60.0000 mg | ORAL_TABLET | Freq: Once | ORAL | Status: AC
  Administered 2011-10-29: 60 mg via ORAL
  Filled 2011-10-29: qty 1

## 2011-10-29 MED ORDER — OXYCODONE-ACETAMINOPHEN 5-325 MG PO TABS
2.0000 | ORAL_TABLET | Freq: Once | ORAL | Status: AC
  Administered 2011-10-29: 2 via ORAL
  Filled 2011-10-29 (×2): qty 2

## 2011-10-29 MED ORDER — ALBUTEROL SULFATE HFA 108 (90 BASE) MCG/ACT IN AERS: 2.0000 | INHALATION_SPRAY | RESPIRATORY_TRACT | Status: DC | PRN

## 2011-10-29 MED ORDER — SULFAMETHOXAZOLE-TMP DS 800-160 MG PO TABS
1.0000 | ORAL_TABLET | Freq: Once | ORAL | Status: AC
Start: 2011-10-29 — End: 2011-10-29
  Administered 2011-10-29: 1 via ORAL
  Filled 2011-10-29: qty 1

## 2011-10-29 MED ORDER — OXYCODONE-ACETAMINOPHEN 5-325 MG PO TABS: 2.0000 | ORAL_TABLET | ORAL | Status: DC | PRN

## 2011-10-29 NOTE — ED Provider Notes (Signed)
History     CSN: [id number]  Arrival date & time 10/28/11  2220   First MD Initiated Contact with Patient 10/29/11 0019      Chief Complaint  Patient presents with  . Otalgia    (Consider location/radiation/quality/duration/timing/severity/associated sxs/prior treatment) HPI 26 year old has 2 days of nasal congestion cough right ear pain wheezing and shortness of breath. She ran out of her inhaler. Her pain is moderately severe to the right ear which is somewhat worse if she pulls on her right ear. Her shortness of breath is mild. There is no treatment prior to arrival. There is no fever rash or confusion. She is no change in vision. She is no lateralizing weakness or numbness. There is no stiff neck. She does have chest pain worse with cough and palpation and position changes and movement but not exertion. She is no abdominal pain vomiting or diarrhea. She is no rash. Past Medical History  Diagnosis Date  . Bipolar 1 disorder   . Anxiety   . Asthma   . Bipolar 1 disorder   . Anxiety     Past Surgical History  Procedure Date  . Tonsillectomy   . Cervical biopsy  w/ loop electrode excision     No family history on file.  History  Substance Use Topics  . Smoking status: Current Every Day Smoker -- 0.5 packs/day  . Smokeless tobacco: Not on file  . Alcohol Use: No    OB History    Grav Para Term Preterm Abortions TAB SAB Ect Mult Living                  Review of Systems 10 Systems reviewed and are negative for acute change except as noted in the HPI. Allergies  Amoxicillin-pot clavulanate and Codeine  Home Medications   Current Outpatient Rx  Name Route Sig Dispense Refill  . ALBUTEROL SULFATE HFA 108 (90 BASE) MCG/ACT IN AERS Inhalation Inhale 2 puffs into the lungs every 6 (six) hours as needed. For shortness of breath or wheezing     . ALBUTEROL SULFATE HFA 108 (90 BASE) MCG/ACT IN AERS Inhalation Inhale 2 puffs into the lungs every 2 (two) hours as needed  for wheezing or shortness of breath (cough). 1 Inhaler 0  . LEVONORGESTREL 20 MCG/24HR IU IUD Intrauterine 1 each by Intrauterine route once. Inserted May 2008     . NEOMYCIN-POLYMYXIN-HC 3.5-10000-1 OT SUSP Otic Place 4 drops in ear(s) 4 (four) times daily. X 7 days 10 mL 0  . OXYCODONE-ACETAMINOPHEN 5-325 MG PO TABS Oral Take 2 tablets by mouth every 4 (four) hours as needed for pain. 10 tablet 0  . PREDNISONE 20 MG PO TABS  2 tabs po daily x 4 days 8 tablet 0  . PROMETHAZINE HCL 25 MG PO TABS Oral Take 1 tablet (25 mg total) by mouth every 6 (six) hours as needed for nausea. 15 tablet 0  . SULFAMETHOXAZOLE-TRIMETHOPRIM 800-160 MG PO TABS Oral Take 1 tablet by mouth 2 (two) times daily. One po bid x 7 days 14 tablet 0    BP 145/78  Pulse 110  Temp 98.3 F (36.8 C) (Oral)  Resp 22  SpO2 100%  Physical Exam  Nursing note and vitals reviewed. Constitutional:       Awake, alert, nontoxic appearance.  HENT:  Head: Atraumatic.  Mouth/Throat: Oropharynx is clear and moist. No oropharyngeal exudate.       Her left tympanic membrane and external auditory canal appear normal except for  some scarring to her tympanic membrane. Her right external auditory canal is mildly erythematous and tender with questionable scant purulent discharge to the floor of the canal near the tympanic membrane, the tympanic membrane itself has scarring with abnormal light reflex and questionable purulence at the base with no obvious perforation. Her right ear has no erythema or swelling but she does have worse right ear pain with traction to her right ear.  Eyes: Conjunctivae normal are normal. Pupils are equal, round, and reactive to light. Right eye exhibits no discharge. Left eye exhibits no discharge.  Neck: Neck supple.  Cardiovascular: Regular rhythm.   No murmur heard.      Tachycardic  Pulmonary/Chest: Effort normal. No respiratory distress. She has wheezes. She has no rales. She exhibits tenderness.        Patient is able to speak full sentences with only minimal to mild respiratory distress, she is mild scattered end expiratory wheezes without crackles retractions or accessory muscle usage. Her pulse oximetry is normal at 100% on room air.  Abdominal: Soft. There is no tenderness. There is no rebound.  Musculoskeletal: She exhibits no tenderness.       Baseline ROM, no obvious new focal weakness.  Lymphadenopathy:    She has no cervical adenopathy.  Neurological: She is alert.       Mental status and motor strength appears baseline for patient and situation.  Skin: No rash noted.  Psychiatric: She has a normal mood and affect.    ED Course  Procedures (including critical care time)  Labs Reviewed - No data to display No results found.   1. Asthma exacerbation   2. Upper respiratory infection   3. Right otitis externa   4. Right otitis media       MDM  The recorded information has been reviewed and considered. Pt stable in ED with no significant deterioration in condition.Patient / Family / Caregiver informed of clinical course, understand medical decision-making process, and agree with plan.        Hurman Horn, MD 10/29/11 [phone]

## 2011-10-29 NOTE — ED Notes (Signed)
Patient called and states she can not afford the Albuterol inhaler since her insurance will not cover the cost.  States the insurance will cover proair.  Chart reviewed with Dr. Roselyn Bering.  OK to substitute [employer].  Called prescription to [facility] @ [phone].  Called pt with update.

## 2011-11-13 ENCOUNTER — Encounter (HOSPITAL_BASED_OUTPATIENT_CLINIC_OR_DEPARTMENT_OTHER): Payer: Self-pay | Admitting: *Deleted

## 2011-11-13 DIAGNOSIS — F411 Generalized anxiety disorder: Secondary | ICD-10-CM | POA: Insufficient documentation

## 2011-11-13 DIAGNOSIS — F17200 Nicotine dependence, unspecified, uncomplicated: Secondary | ICD-10-CM | POA: Insufficient documentation

## 2011-11-13 DIAGNOSIS — R112 Nausea with vomiting, unspecified: Secondary | ICD-10-CM | POA: Insufficient documentation

## 2011-11-13 DIAGNOSIS — F319 Bipolar disorder, unspecified: Secondary | ICD-10-CM | POA: Insufficient documentation

## 2011-11-13 DIAGNOSIS — R1012 Left upper quadrant pain: Secondary | ICD-10-CM | POA: Insufficient documentation

## 2011-11-13 DIAGNOSIS — J45909 Unspecified asthma, uncomplicated: Secondary | ICD-10-CM | POA: Insufficient documentation

## 2011-11-13 LAB — URINALYSIS, ROUTINE W REFLEX MICROSCOPIC
Glucose, UA: NEGATIVE mg/dL
Hgb urine dipstick: NEGATIVE
Specific Gravity, Urine: 1.031 — ABNORMAL HIGH (ref 1.005–1.030)

## 2011-11-13 LAB — PREGNANCY, URINE: Preg Test, Ur: NEGATIVE

## 2011-11-13 LAB — URINE MICROSCOPIC-ADD ON

## 2011-11-13 NOTE — ED Notes (Signed)
Pt c/o left flank pain  X 1 hr

## 2011-11-14 ENCOUNTER — Emergency Department (HOSPITAL_BASED_OUTPATIENT_CLINIC_OR_DEPARTMENT_OTHER)
Admission: EM | Admit: 2011-11-14 | Discharge: 2011-11-14 | Disposition: A | Discharge status: Home / Self Care | Payer: Medicare Other | Attending: Emergency Medicine | Admitting: Emergency Medicine

## 2011-11-14 DIAGNOSIS — R1110 Vomiting, unspecified: Secondary | ICD-10-CM

## 2011-11-14 LAB — BASIC METABOLIC PANEL WITH GFR
BUN: 14 mg/dL (ref 6–23)
CO2: 24 meq/L (ref 19–32)
Calcium: 9 mg/dL (ref 8.4–10.5)
Chloride: 105 meq/L (ref 96–112)
Creatinine, Ser: 0.7 mg/dL (ref 0.50–1.10)
GFR calc Af Amer: >90 mL/min
GFR calc non Af Amer: >90 mL/min
Glucose, Bld: 94 mg/dL (ref 70–99)
Potassium: 3.7 meq/L (ref 3.5–5.1)
Sodium: 139 meq/L (ref 135–145)

## 2011-11-14 LAB — CBC WITH DIFFERENTIAL/PLATELET
Basophils Absolute: 0 10*3/uL (ref 0.0–0.1)
Basophils Relative: 0 % (ref 0–1)
Eosinophils Absolute: 0.1 10*3/uL (ref 0.0–0.7)
Eosinophils Relative: 1 % (ref 0–5)
HCT: 33.3 % — ABNORMAL LOW (ref 36.0–46.0)
Hemoglobin: 11.4 g/dL — ABNORMAL LOW (ref 12.0–15.0)
Lymphocytes Relative: 26 % (ref 12–46)
Lymphs Abs: 2.5 10*3/uL (ref 0.7–4.0)
MCH: 32.3 pg (ref 26.0–34.0)
MCHC: 34.2 g/dL (ref 30.0–36.0)
MCV: 94.3 fL (ref 78.0–100.0)
Monocytes Absolute: 0.7 10*3/uL (ref 0.1–1.0)
Monocytes Relative: 8 % (ref 3–12)
Neutro Abs: 6.5 10*3/uL (ref 1.7–7.7)
Neutrophils Relative %: 66 % (ref 43–77)
Platelets: 194 10*3/uL (ref 150–400)
RBC: 3.53 MIL/uL — ABNORMAL LOW (ref 3.87–5.11)
RDW: 11.4 % — ABNORMAL LOW (ref 11.5–15.5)
WBC: 9.9 10*3/uL (ref 4.0–10.5)

## 2011-11-14 MED ORDER — PROMETHAZINE HCL 12.5 MG RE SUPP: 25.0000 mg | Freq: Four times a day (QID) | RECTAL | Status: DC | PRN

## 2011-11-14 MED ORDER — PROMETHAZINE HCL 25 MG/ML IJ SOLN
25.0000 mg | Freq: Once | INTRAMUSCULAR | Status: AC
  Administered 2011-11-14: 25 mg via INTRAMUSCULAR
  Filled 2011-11-14: qty 1

## 2011-11-14 MED ORDER — ONDANSETRON 8 MG PO TBDP
8.0000 mg | ORAL_TABLET | Freq: Once | ORAL | Status: AC
  Administered 2011-11-14: 8 mg via ORAL
  Filled 2011-11-14: qty 1

## 2011-11-14 MED ORDER — KETOROLAC TROMETHAMINE 60 MG/2ML IM SOLN
60.0000 mg | Freq: Once | INTRAMUSCULAR | Status: AC
  Administered 2011-11-14: 60 mg via INTRAMUSCULAR
  Filled 2011-11-14: qty 2

## 2011-11-14 MED ORDER — GI COCKTAIL ~~LOC~~
30.0000 mL | Freq: Once | ORAL | Status: AC
  Administered 2011-11-14: 30 mL via ORAL
  Filled 2011-11-14: qty 30

## 2011-11-14 NOTE — ED Provider Notes (Signed)
History     CSN: [id number]  Arrival date & time 11/13/11  2331   First MD Initiated Contact with Patient 11/14/11 0036      Chief Complaint  Patient presents with  . Flank Pain    (Consider location/radiation/quality/duration/timing/severity/associated sxs/prior treatment) Patient is a 26 y.o. female presenting with abdominal pain. The history is provided by the patient. No language interpreter was used.  Abdominal Pain The primary symptoms of the illness include abdominal pain, nausea and vomiting. The primary symptoms of the illness do not include fever, fatigue, diarrhea or dysuria. The current episode started 3 to 5 hours ago. The onset of the illness was sudden. The problem has not changed since onset. The abdominal pain began 6 to 12 hours ago. The pain came on suddenly. The abdominal pain has been unchanged since its onset. The abdominal pain is located in the LUQ. The abdominal pain does not radiate. The severity of the abdominal pain is 10/10. The abdominal pain is relieved by nothing. The abdominal pain is exacerbated by vomiting.  Nausea began yesterday.  Vomiting occurs 2 to 5 times per day. The emesis contains stomach contents.  The patient states that she believes she is currently not pregnant. The patient has not had a change in bowel habit. Significant associated medical issues do not include PUD.    Past Medical History  Diagnosis Date  . Bipolar 1 disorder   . Anxiety   . Asthma   . Bipolar 1 disorder   . Anxiety     Past Surgical History  Procedure Date  . Tonsillectomy   . Cervical biopsy  w/ loop electrode excision     History reviewed. No pertinent family history.  History  Substance Use Topics  . Smoking status: Current Every Day Smoker -- 0.5 packs/day  . Smokeless tobacco: Not on file  . Alcohol Use: No    OB History    Grav Para Term Preterm Abortions TAB SAB Ect Mult Living                  Review of Systems  Constitutional: Negative  for fever and fatigue.  Gastrointestinal: Positive for nausea, vomiting and abdominal pain. Negative for diarrhea.  Genitourinary: Negative for dysuria and flank pain.  All other systems reviewed and are negative.    Allergies  Amoxicillin-pot clavulanate and Codeine  Home Medications   Current Outpatient Rx  Name Route Sig Dispense Refill  . ALBUTEROL SULFATE HFA 108 (90 BASE) MCG/ACT IN AERS Inhalation Inhale 2 puffs into the lungs every 6 (six) hours as needed. For shortness of breath or wheezing     . ALBUTEROL SULFATE HFA 108 (90 BASE) MCG/ACT IN AERS Inhalation Inhale 2 puffs into the lungs every 2 (two) hours as needed for wheezing or shortness of breath (cough). 1 Inhaler 0  . LEVONORGESTREL 20 MCG/24HR IU IUD Intrauterine 1 each by Intrauterine route once. Inserted May 2008     . NEOMYCIN-POLYMYXIN-HC 3.5-10000-1 OT SUSP Otic Place 4 drops in ear(s) 4 (four) times daily. X 7 days 10 mL 0  . OXYCODONE-ACETAMINOPHEN 5-325 MG PO TABS Oral Take 2 tablets by mouth every 4 (four) hours as needed for pain. 10 tablet 0  . PREDNISONE 20 MG PO TABS  2 tabs po daily x 4 days 8 tablet 0  . PROMETHAZINE HCL 25 MG PO TABS Oral Take 1 tablet (25 mg total) by mouth every 6 (six) hours as needed for nausea. 15 tablet 0  .  SULFAMETHOXAZOLE-TRIMETHOPRIM 800-160 MG PO TABS Oral Take 1 tablet by mouth 2 (two) times daily. One po bid x 7 days 14 tablet 0    BP 117/76  Pulse 84  Temp 98.4 F (36.9 C) (Oral)  Resp 18  Ht 5\' 3"  (1.6 m)  Wt 135 lb (61.236 kg)  BMI 23.91 kg/m2  SpO2 100%  Physical Exam  Constitutional: She is oriented to person, place, and time. She appears well-developed and well-nourished. No distress.  HENT:  Head: Normocephalic and atraumatic.  Mouth/Throat: Oropharynx is clear and moist.  Eyes: Conjunctivae normal are normal. Pupils are equal, round, and reactive to light.  Neck: Normal range of motion. Neck supple.  Cardiovascular: Normal rate and regular rhythm.     Pulmonary/Chest: Effort normal and breath sounds normal. She has no wheezes. She has no rales.  Abdominal: Soft. Bowel sounds are normal. There is no tenderness. There is no rebound and no guarding.  Musculoskeletal: Normal range of motion.  Neurological: She is alert and oriented to person, place, and time.  Skin: Skin is warm and dry.  Psychiatric: She has a normal mood and affect.    ED Course  Procedures (including critical care time)  Labs Reviewed  URINALYSIS, ROUTINE W REFLEX MICROSCOPIC - Abnormal; Notable for the following:    Specific Gravity, Urine 1.031 (*)     Bilirubin Urine SMALL (*)     Ketones, ur 15 (*)     Protein, ur 30 (*)     All other components within normal limits  URINE MICROSCOPIC-ADD ON - Abnormal; Notable for the following:    Squamous Epithelial / LPF FEW (*)     Bacteria, UA FEW (*)     All other components within normal limits  CBC WITH DIFFERENTIAL - Abnormal; Notable for the following:    RBC 3.53 (*)     Hemoglobin 11.4 (*)     HCT 33.3 (*)     RDW 11.4 (*)     All other components within normal limits  PREGNANCY, URINE  BASIC METABOLIC PANEL   No results found.   No diagnosis found.    MDM  Follow up with your family doctor for ongoing care        Shuaib Corsino K Blia Totman-Rasch, MD 11/14/11 0139

## 2013-02-06 ENCOUNTER — Emergency Department (HOSPITAL_BASED_OUTPATIENT_CLINIC_OR_DEPARTMENT_OTHER)
Admission: EM | Admit: 2013-02-06 | Discharge: 2013-02-06 | Disposition: A | Discharge status: Home / Self Care | Payer: Medicaid Other | Attending: Emergency Medicine | Admitting: Emergency Medicine

## 2013-02-06 ENCOUNTER — Encounter (HOSPITAL_BASED_OUTPATIENT_CLINIC_OR_DEPARTMENT_OTHER): Payer: Self-pay | Admitting: Emergency Medicine

## 2013-02-06 DIAGNOSIS — R6889 Other general symptoms and signs: Secondary | ICD-10-CM

## 2013-02-06 DIAGNOSIS — Z79899 Other long term (current) drug therapy: Secondary | ICD-10-CM | POA: Insufficient documentation

## 2013-02-06 DIAGNOSIS — J3489 Other specified disorders of nose and nasal sinuses: Secondary | ICD-10-CM | POA: Insufficient documentation

## 2013-02-06 DIAGNOSIS — IMO0001 Reserved for inherently not codable concepts without codable children: Secondary | ICD-10-CM | POA: Insufficient documentation

## 2013-02-06 DIAGNOSIS — Z880 Allergy status to penicillin: Secondary | ICD-10-CM | POA: Insufficient documentation

## 2013-02-06 DIAGNOSIS — J111 Influenza due to unidentified influenza virus with other respiratory manifestations: Secondary | ICD-10-CM | POA: Insufficient documentation

## 2013-02-06 DIAGNOSIS — N390 Urinary tract infection, site not specified: Secondary | ICD-10-CM | POA: Insufficient documentation

## 2013-02-06 DIAGNOSIS — A599 Trichomoniasis, unspecified: Secondary | ICD-10-CM

## 2013-02-06 DIAGNOSIS — O2340 Unspecified infection of urinary tract in pregnancy, unspecified trimester: Secondary | ICD-10-CM

## 2013-02-06 DIAGNOSIS — J029 Acute pharyngitis, unspecified: Secondary | ICD-10-CM | POA: Insufficient documentation

## 2013-02-06 DIAGNOSIS — R350 Frequency of micturition: Secondary | ICD-10-CM | POA: Insufficient documentation

## 2013-02-06 DIAGNOSIS — Z8659 Personal history of other mental and behavioral disorders: Secondary | ICD-10-CM | POA: Insufficient documentation

## 2013-02-06 DIAGNOSIS — O2390 Unspecified genitourinary tract infection in pregnancy, unspecified trimester: Secondary | ICD-10-CM | POA: Insufficient documentation

## 2013-02-06 DIAGNOSIS — J45909 Unspecified asthma, uncomplicated: Secondary | ICD-10-CM | POA: Insufficient documentation

## 2013-02-06 DIAGNOSIS — IMO0002 Reserved for concepts with insufficient information to code with codable children: Secondary | ICD-10-CM | POA: Insufficient documentation

## 2013-02-06 DIAGNOSIS — O210 Mild hyperemesis gravidarum: Secondary | ICD-10-CM | POA: Insufficient documentation

## 2013-02-06 DIAGNOSIS — O99330 Smoking (tobacco) complicating pregnancy, unspecified trimester: Secondary | ICD-10-CM | POA: Insufficient documentation

## 2013-02-06 LAB — URINALYSIS, ROUTINE W REFLEX MICROSCOPIC
Nitrite: NEGATIVE
Specific Gravity, Urine: 1.034 — ABNORMAL HIGH (ref 1.005–1.030)
pH: 6.5 (ref 5.0–8.0)

## 2013-02-06 LAB — CBC WITH DIFFERENTIAL/PLATELET
Basophils Absolute: 0 10*3/uL (ref 0.0–0.1)
HCT: 31.6 % — ABNORMAL LOW (ref 36.0–46.0)
Lymphocytes Relative: 14 % (ref 12–46)
Monocytes Absolute: 0.7 10*3/uL (ref 0.1–1.0)
Neutro Abs: 9.2 10*3/uL — ABNORMAL HIGH (ref 1.7–7.7)
Neutrophils Relative %: 80 % — ABNORMAL HIGH (ref 43–77)
RDW: 11.6 % (ref 11.5–15.5)
WBC: 11.5 10*3/uL — ABNORMAL HIGH (ref 4.0–10.5)

## 2013-02-06 LAB — COMPREHENSIVE METABOLIC PANEL
Alkaline Phosphatase: 116 U/L (ref 39–117)
BUN: 5 mg/dL — ABNORMAL LOW (ref 6–23)
Calcium: 9.2 mg/dL (ref 8.4–10.5)
GFR calc Af Amer: >90 mL/min (ref >90)
GFR calc non Af Amer: >90 mL/min (ref >90)
Glucose, Bld: 71 mg/dL (ref 70–99)
Total Protein: 7.1 g/dL (ref 6.0–8.3)

## 2013-02-06 LAB — URINE MICROSCOPIC-ADD ON

## 2013-02-06 MED ORDER — ONDANSETRON HCL 4 MG/2ML IJ SOLN
4.0000 mg | Freq: Once | INTRAMUSCULAR | Status: AC
  Administered 2013-02-06: 4 mg via INTRAVENOUS
  Filled 2013-02-06: qty 2

## 2013-02-06 MED ORDER — OSELTAMIVIR PHOSPHATE 75 MG PO CAPS: 75.0000 mg | ORAL_CAPSULE | Freq: Two times a day (BID) | ORAL | Status: DC

## 2013-02-06 MED ORDER — CEPHALEXIN 500 MG PO CAPS: 500.0000 mg | ORAL_CAPSULE | Freq: Three times a day (TID) | ORAL | Status: DC

## 2013-02-06 MED ORDER — METRONIDAZOLE 500 MG PO TABS: ORAL_TABLET | ORAL | Status: DC

## 2013-02-06 MED ORDER — ONDANSETRON 8 MG PO TBDP: 8.0000 mg | ORAL_TABLET | Freq: Three times a day (TID) | ORAL | Status: AC | PRN

## 2013-02-06 MED ORDER — SODIUM CHLORIDE 0.9 % IV SOLN
Freq: Once | INTRAVENOUS | Status: AC
  Administered 2013-02-06: 1000 mL/h via INTRAVENOUS

## 2013-02-06 NOTE — ED Notes (Signed)
Pt taken off of external fetal monitor.

## 2013-02-06 NOTE — Progress Notes (Signed)
Dr. Penne Lash on unit. Viewed tracing.

## 2013-02-06 NOTE — Progress Notes (Signed)
Spoke with [facility] NP and told her that the fhr is reactive. No uc's.

## 2013-02-06 NOTE — Progress Notes (Signed)
Spoke with [facility] NP. States she has already spoken with Dr. Penne Lash about this pt. She says that Dr. Penne Lash wants pt treated for her UTI and monitored for 20 min. If she has a reactive fhr tracing, she can be dc'd home.

## 2013-02-06 NOTE — ED Notes (Signed)
Rx x 4 given for flagyl, keflex, zofran and tamiflu- d/c with ride

## 2013-02-06 NOTE — ED Notes (Signed)
Patient here with 2 days of vomiting and cough. Reports taking nausea meds without relief. Denies diarrhea. Reports that she is [redacted] weeks pregnant and previously had a picc for the vomiting which resolved weeks ago. No distress on arrival

## 2013-02-06 NOTE — Progress Notes (Signed)
Received a call from [facility] about pt arriving with flu-like systems and UTI. Admitted to [facility] for monitoring.

## 2013-02-06 NOTE — ED Provider Notes (Signed)
CSN: [id number]     Arrival date & time 02/06/13  1428 History   First MD Initiated Contact with Patient 02/06/13 1610     Chief Complaint  Patient presents with  . Emesis  . Cough   (Consider location/radiation/quality/duration/timing/severity/associated sxs/prior Treatment) Patient is a 27 y.o. female presenting with vomiting and cough. The history is provided by the patient.  Emesis Severity:  Moderate Duration:  2 days Quality:  Stomach contents Feeding tolerance: nothing. Progression:  Worsening Chronicity:  New Recent urination:  Normal Relieved by:  Nothing Ineffective treatments:  Antiemetics Associated symptoms: cough, fever, myalgias, sore throat and URI   Associated symptoms: no abdominal pain, no chills, no diarrhea and no headaches   Cough Associated symptoms: myalgias and sore throat   Associated symptoms: no chills, no headaches and no rash    ARLANA CANIZALES is a 27 y.o. female @ 33.[redacted] weeks gestation with cough, cold, nausea and vomiting. The n/v started 2 days ago and then yesterday began with cough and congestion. Felt hot like fever but did not take temperature. Took medication that was prescribed for nausea early in the pregnancy but hasn't helped. Good fetal movement. Took OTC cough medication without relief. Patient notified her OB of symptoms and told she may be dehydrated and need IV hydration.   Past Medical History  Diagnosis Date  . Bipolar 1 disorder   . Anxiety   . Asthma   . Bipolar 1 disorder   . Anxiety   . Pregnant    Past Surgical History  Procedure Laterality Date  . Tonsillectomy    . Cervical biopsy  w/ loop electrode excision     No family history on file. History  Substance Use Topics  . Smoking status: Current Every Day Smoker -- 0.50 packs/day  . Smokeless tobacco: Not on file  . Alcohol Use: No   OB History   Grav Para Term Preterm Abortions TAB SAB Ect Mult Living   1              Review of Systems  Constitutional:  Negative for chills.  HENT: Positive for congestion, sinus pressure and sore throat. Negative for trouble swallowing.   Eyes: Negative for visual disturbance.  Respiratory: Positive for cough.   Gastrointestinal: Positive for nausea and vomiting. Negative for abdominal pain and diarrhea.  Genitourinary: Positive for frequency. Negative for dysuria and urgency.  Musculoskeletal: Positive for myalgias.  Skin: Negative for rash.  Neurological: Negative for syncope and headaches.  Psychiatric/Behavioral: Negative for confusion. The patient is not nervous/anxious.     Allergies  Amoxicillin-pot clavulanate and Codeine  Home Medications   Current Outpatient Rx  Name  Route  Sig  Dispense  Refill  . albuterol (PROVENTIL HFA;VENTOLIN HFA) 108 (90 BASE) MCG/ACT inhaler   Inhalation   Inhale 2 puffs into the lungs every 6 (six) hours as needed. For shortness of breath or wheezing          . albuterol (PROVENTIL HFA;VENTOLIN HFA) 108 (90 BASE) MCG/ACT inhaler   Inhalation   Inhale 2 puffs into the lungs every 2 (two) hours as needed for wheezing or shortness of breath (cough).   1 Inhaler   0   . levonorgestrel (MIRENA) 20 MCG/24HR IUD   Intrauterine   1 each by Intrauterine route once. Inserted May 2008          . neomycin-polymyxin-hydrocortisone (CORTISPORIN) 3.5-10000-1 otic suspension   Otic   Place 4 drops in ear(s) 4 (four) times  daily. X 7 days   10 mL   0   . oxyCODONE-acetaminophen (PERCOCET) 5-325 MG per tablet   Oral   Take 2 tablets by mouth every 4 (four) hours as needed for pain.   10 tablet   0   . predniSONE (DELTASONE) 20 MG tablet      2 tabs po daily x 4 days   8 tablet   0   . promethazine (PHENERGAN) 12.5 MG suppository   Rectal   Place 2 suppositories (25 mg total) rectally every 6 (six) hours as needed for nausea.   6 each   0   . EXPIRED: promethazine (PHENERGAN) 25 MG tablet   Oral   Take 1 tablet (25 mg total) by mouth every 6 (six) hours  as needed for nausea.   15 tablet   0   . sulfamethoxazole-trimethoprim (BACTRIM DS,SEPTRA DS) 800-160 MG per tablet   Oral   Take 1 tablet by mouth 2 (two) times daily. One po bid x 7 days   14 tablet   0    BP 114/60  Pulse 99  Temp(Src) 98.3 F (36.8 C) (Oral)  Resp 18  SpO2 95%  LMP 07/02/2012 Physical Exam  Nursing note and vitals reviewed. Constitutional: She is oriented to person, place, and time. She appears well-developed and well-nourished. No distress.  HENT:  Head: Normocephalic and atraumatic.  Eyes: Conjunctivae and EOM are normal. Pupils are equal, round, and reactive to light.  Neck: Normal range of motion. Neck supple.  Cardiovascular: Normal rate, regular rhythm and normal heart sounds.   Pulmonary/Chest: Effort normal. She has no wheezes. She has no rales.  Abdominal: Soft. Bowel sounds are normal. There is no tenderness.  Gravid at 33 weeks gestion  Musculoskeletal: Normal range of motion.  Neurological: She is alert and oriented to person, place, and time. No cranial nerve deficit.  Skin: Skin is warm and dry.  Psychiatric: She has a normal mood and affect. Her behavior is normal.   Results for orders placed during the hospital encounter of 02/06/13 (from the past 24 hour(s))  COMPREHENSIVE METABOLIC PANEL     Status: Abnormal   Collection Time    02/06/13  5:05 PM      Result Value Range   Sodium 138  135 - 145 mEq/L   Potassium 3.8  3.5 - 5.1 mEq/L   Chloride 103  96 - 112 mEq/L   CO2 22  19 - 32 mEq/L   Glucose, Bld 71  70 - 99 mg/dL   BUN 5 (*) 6 - 23 mg/dL   Creatinine, Ser 1.61  0.50 - 1.10 mg/dL   Calcium 9.2  8.4 - 09.6 mg/dL   Total Protein 7.1  6.0 - 8.3 g/dL   Albumin 3.4 (*) 3.5 - 5.2 g/dL   AST 13  0 - 37 U/L   ALT 6  0 - 35 U/L   Alkaline Phosphatase 116  39 - 117 U/L   Total Bilirubin 0.7  0.3 - 1.2 mg/dL   GFR calc non Af Amer >90  >90 mL/min   GFR calc Af Amer >90  >90 mL/min  CBC WITH DIFFERENTIAL     Status: Abnormal    Collection Time    02/06/13  5:05 PM      Result Value Range   WBC 11.5 (*) 4.0 - 10.5 K/uL   RBC 3.21 (*) 3.87 - 5.11 MIL/uL   Hemoglobin 10.9 (*) 12.0 - 15.0 g/dL  HCT 31.6 (*) 36.0 - 46.0 %   MCV 98.4  78.0 - 100.0 fL   MCH 34.0  26.0 - 34.0 pg   MCHC 34.5  30.0 - 36.0 g/dL   RDW 45.4  09.8 - 11.9 %   Platelets 209  150 - 400 K/uL   Neutrophils Relative % 80 (*) 43 - 77 %   Neutro Abs 9.2 (*) 1.7 - 7.7 K/uL   Lymphocytes Relative 14  12 - 46 %   Lymphs Abs 1.6  0.7 - 4.0 K/uL   Monocytes Relative 6  3 - 12 %   Monocytes Absolute 0.7  0.1 - 1.0 K/uL   Eosinophils Relative 0  0 - 5 %   Eosinophils Absolute 0.0  0.0 - 0.7 K/uL   Basophils Relative 0  0 - 1 %   Basophils Absolute 0.0  0.0 - 0.1 K/uL  URINALYSIS, ROUTINE W REFLEX MICROSCOPIC     Status: Abnormal   Collection Time    02/06/13  5:05 PM      Result Value Range   Color, Urine AMBER (*) YELLOW   APPearance CLOUDY (*) CLEAR   Specific Gravity, Urine 1.034 (*) 1.005 - 1.030   pH 6.5  5.0 - 8.0   Glucose, UA NEGATIVE  NEGATIVE mg/dL   Hgb urine dipstick NEGATIVE  NEGATIVE   Bilirubin Urine MODERATE (*) NEGATIVE   Ketones, ur 40 (*) NEGATIVE mg/dL   Protein, ur 30 (*) NEGATIVE mg/dL   Urobilinogen, UA 1.0  0.0 - 1.0 mg/dL   Nitrite NEGATIVE  NEGATIVE   Leukocytes, UA MODERATE (*) NEGATIVE  URINE MICROSCOPIC-ADD ON     Status: Abnormal   Collection Time    02/06/13  5:05 PM      Result Value Range   Squamous Epithelial / LPF MANY (*) RARE   WBC, UA 11-20  <3 WBC/hpf   Bacteria, UA MANY (*) RARE   Urine-Other TRICHOMONAS PRESENT      ED Course: Discussed with Dr. Penne Lash [facility] OB   ProceduresIV hydration, zofran,  Patient feeling better after IV hydration EFM tracing reviewed by Rapid Response OB nurse and is reactive and without contractions.  MDM  27 y.o. female @ [redacted] weeks gestation with nausea, vomiting and flu like symptoms. Will treat patient's UTI and nausea. Will give Rx to treat trichomonas  but she will wait until the nausea has subsided. She will follow up with her OB for further evaluation. She will return for any problems.  Discussed with the patient and all questioned fully answered. She voices understanding.    Medication List    TAKE these medications       cephALEXin 500 MG capsule  Commonly known as:  KEFLEX  Take 1 capsule (500 mg total) by mouth 3 (three) times daily.     metroNIDAZOLE 500 MG tablet  Commonly known as:  FLAGYL  Take one tablet PO bid for infection. (Start this medication after your nausea and vomiting has stopped).     ondansetron 8 MG disintegrating tablet  Commonly known as:  ZOFRAN ODT  Take 1 tablet (8 mg total) by mouth every 8 (eight) hours as needed for nausea or vomiting.     oseltamivir 75 MG capsule  Commonly known as:  TAMIFLU  Take 1 capsule (75 mg total) by mouth every 12 (twelve) hours.      ASK your doctor about these medications       albuterol 108 (90 BASE) MCG/ACT inhaler  Commonly known as:  PROVENTIL HFA;VENTOLIN HFA  Inhale 2 puffs into the lungs every 6 (six) hours as needed. For shortness of breath or wheezing     albuterol 108 (90 BASE) MCG/ACT inhaler  Commonly known as:  PROVENTIL HFA;VENTOLIN HFA  Inhale 2 puffs into the lungs every 2 (two) hours as needed for wheezing or shortness of breath (cough).     levonorgestrel 20 MCG/24HR IUD  Commonly known as:  MIRENA  1 each by Intrauterine route once. Inserted May 2008     neomycin-polymyxin-hydrocortisone 3.5-10000-1 otic suspension  Commonly known as:  CORTISPORIN  Place 4 drops in ear(s) 4 (four) times daily. X 7 days     oxyCODONE-acetaminophen 5-325 MG per tablet  Commonly known as:  PERCOCET  Take 2 tablets by mouth every 4 (four) hours as needed for pain.     predniSONE 20 MG tablet  Commonly known as:  DELTASONE  2 tabs po daily x 4 days     promethazine 25 MG tablet  Commonly known as:  PHENERGAN  Take 1 tablet (25 mg total) by mouth every 6  (six) hours as needed for nausea.     promethazine 12.5 MG suppository  Commonly known as:  PHENERGAN  Place 2 suppositories (25 mg total) rectally every 6 (six) hours as needed for nausea.     sulfamethoxazole-trimethoprim 800-160 MG per tablet  Commonly known as:  BACTRIM DS,SEPTRA DS  Take 1 tablet by mouth 2 (two) times daily. One po bid x 7 days         [employer], NP 02/08/13 [id number]

## 2013-02-08 LAB — URINE CULTURE: Colony Count: >=100000

## 2013-02-15 NOTE — ED Provider Notes (Signed)
Medical screening examination/treatment/procedure(s) were performed by non-physician practitioner and as supervising physician I was immediately available for consultation/collaboration.  EKG Interpretation   None         Kimberly PoundMichael Y. Bryley Kovacevic, MD 02/15/13 1257

## 2013-04-01 ENCOUNTER — Emergency Department (HOSPITAL_BASED_OUTPATIENT_CLINIC_OR_DEPARTMENT_OTHER): Payer: Medicare Other

## 2013-04-01 ENCOUNTER — Encounter (HOSPITAL_BASED_OUTPATIENT_CLINIC_OR_DEPARTMENT_OTHER): Payer: Self-pay | Admitting: Emergency Medicine

## 2013-04-01 ENCOUNTER — Other Ambulatory Visit (HOSPITAL_BASED_OUTPATIENT_CLINIC_OR_DEPARTMENT_OTHER): Payer: Medicare Other

## 2013-04-01 ENCOUNTER — Emergency Department (HOSPITAL_BASED_OUTPATIENT_CLINIC_OR_DEPARTMENT_OTHER)
Admission: EM | Admit: 2013-04-01 | Discharge: 2013-04-01 | Disposition: A | Discharge status: Home / Self Care | Payer: Medicare Other | Attending: Emergency Medicine | Admitting: Emergency Medicine

## 2013-04-01 DIAGNOSIS — O909 Complication of the puerperium, unspecified: Principal | ICD-10-CM

## 2013-04-01 DIAGNOSIS — R112 Nausea with vomiting, unspecified: Secondary | ICD-10-CM

## 2013-04-01 DIAGNOSIS — O2390 Unspecified genitourinary tract infection in pregnancy, unspecified trimester: Secondary | ICD-10-CM | POA: Insufficient documentation

## 2013-04-01 DIAGNOSIS — O99335 Smoking (tobacco) complicating the puerperium: Secondary | ICD-10-CM | POA: Insufficient documentation

## 2013-04-01 DIAGNOSIS — R109 Unspecified abdominal pain: Secondary | ICD-10-CM

## 2013-04-01 DIAGNOSIS — O99345 Other mental disorders complicating the puerperium: Secondary | ICD-10-CM | POA: Insufficient documentation

## 2013-04-01 DIAGNOSIS — F411 Generalized anxiety disorder: Secondary | ICD-10-CM | POA: Insufficient documentation

## 2013-04-01 DIAGNOSIS — F319 Bipolar disorder, unspecified: Secondary | ICD-10-CM | POA: Insufficient documentation

## 2013-04-01 DIAGNOSIS — Z79899 Other long term (current) drug therapy: Secondary | ICD-10-CM | POA: Insufficient documentation

## 2013-04-01 DIAGNOSIS — R1031 Right lower quadrant pain: Secondary | ICD-10-CM | POA: Insufficient documentation

## 2013-04-01 DIAGNOSIS — A5903 Trichomonal cystitis and urethritis: Secondary | ICD-10-CM | POA: Insufficient documentation

## 2013-04-01 DIAGNOSIS — R1032 Left lower quadrant pain: Secondary | ICD-10-CM | POA: Insufficient documentation

## 2013-04-01 DIAGNOSIS — O26899 Other specified pregnancy related conditions, unspecified trimester: Secondary | ICD-10-CM | POA: Insufficient documentation

## 2013-04-01 DIAGNOSIS — Z9089 Acquired absence of other organs: Secondary | ICD-10-CM | POA: Insufficient documentation

## 2013-04-01 DIAGNOSIS — IMO0002 Reserved for concepts with insufficient information to code with codable children: Secondary | ICD-10-CM | POA: Insufficient documentation

## 2013-04-01 DIAGNOSIS — J45909 Unspecified asthma, uncomplicated: Secondary | ICD-10-CM | POA: Insufficient documentation

## 2013-04-01 LAB — URINALYSIS, ROUTINE W REFLEX MICROSCOPIC
GLUCOSE, UA: NEGATIVE mg/dL
KETONES UR: NEGATIVE mg/dL
Nitrite: NEGATIVE
PROTEIN: 30 mg/dL — AB
Specific Gravity, Urine: 1.023 (ref 1.005–1.030)
Urobilinogen, UA: 1 mg/dL (ref 0.0–1.0)
pH: 6.5 (ref 5.0–8.0)

## 2013-04-01 LAB — URINE MICROSCOPIC-ADD ON

## 2013-04-01 LAB — CBC
HCT: 36.1 % (ref 36.0–46.0)
Hemoglobin: 12.1 g/dL (ref 12.0–15.0)
MCH: 33.2 pg (ref 26.0–34.0)
MCHC: 33.5 g/dL (ref 30.0–36.0)
MCV: 98.9 fL (ref 78.0–100.0)
PLATELETS: 280 10*3/uL (ref 150–400)
RBC: 3.65 MIL/uL — ABNORMAL LOW (ref 3.87–5.11)
RDW: 11.4 % — AB (ref 11.5–15.5)
WBC: 8 10*3/uL (ref 4.0–10.5)

## 2013-04-01 LAB — BASIC METABOLIC PANEL
BUN: 13 mg/dL (ref 6–23)
CALCIUM: 9.2 mg/dL (ref 8.4–10.5)
CO2: 24 meq/L (ref 19–32)
CREATININE: 0.8 mg/dL (ref 0.50–1.10)
Chloride: 105 mEq/L (ref 96–112)
Glucose, Bld: 80 mg/dL (ref 70–99)
Potassium: 4.1 mEq/L (ref 3.7–5.3)
Sodium: 142 mEq/L (ref 137–147)

## 2013-04-01 MED ORDER — CEPHALEXIN 250 MG PO CAPS: 500.0000 mg | ORAL_CAPSULE | Freq: Once | ORAL | Status: DC

## 2013-04-01 MED ORDER — OXYCODONE-ACETAMINOPHEN 5-325 MG PO TABS
2.0000 | ORAL_TABLET | Freq: Once | ORAL | Status: AC
  Administered 2013-04-01: 2 via ORAL
  Filled 2013-04-01: qty 2

## 2013-04-01 MED ORDER — ONDANSETRON HCL 4 MG PO TABS: 4.0000 mg | ORAL_TABLET | Freq: Four times a day (QID) | ORAL | Status: DC

## 2013-04-01 MED ORDER — MORPHINE SULFATE 4 MG/ML IJ SOLN
4.0000 mg | Freq: Once | INTRAMUSCULAR | Status: AC
  Administered 2013-04-01: 4 mg via INTRAMUSCULAR
  Filled 2013-04-01: qty 1

## 2013-04-01 MED ORDER — CEPHALEXIN 500 MG PO CAPS: 500.0000 mg | ORAL_CAPSULE | Freq: Four times a day (QID) | ORAL | Status: DC

## 2013-04-01 MED ORDER — METRONIDAZOLE 500 MG PO TABS: 500.0000 mg | ORAL_TABLET | Freq: Two times a day (BID) | ORAL | Status: DC

## 2013-04-01 MED ORDER — PROMETHAZINE HCL 25 MG PO TABS
25.0000 mg | ORAL_TABLET | ORAL | Status: AC
  Administered 2013-04-01: 25 mg via ORAL
  Filled 2013-04-01: qty 1

## 2013-04-01 MED ORDER — METOCLOPRAMIDE HCL 5 MG/ML IJ SOLN
10.0000 mg | Freq: Once | INTRAMUSCULAR | Status: AC
  Administered 2013-04-01: 10 mg via INTRAMUSCULAR
  Filled 2013-04-01: qty 2

## 2013-04-01 NOTE — ED Notes (Signed)
Pa  at bedside. 

## 2013-04-01 NOTE — ED Notes (Signed)
Pt working on ride

## 2013-04-01 NOTE — ED Provider Notes (Signed)
CSN: [id number]     Arrival date & time 04/01/13  1600 History   First MD Initiated Contact with Patient 04/01/13 1630     Chief Complaint  Patient presents with  . Abdominal Pain     (Consider location/radiation/quality/duration/timing/severity/associated sxs/prior Treatment) HPI Comments: Patient is a 28 year old [name] presents for lower abdominal pain. Patient states that pain is associated with vaginal delivery on 03/22/2013. Pain is constant in nature and without aggravating or alleviating factors. Patient has taken Tylenol and ibuprofen for the pain without relief. She endorses associated nausea and nonbloody/nonbilious emesis. Patient states last episode of emesis was one hour prior to arrival. She has been taking Zofran for emesis without relief. Furthermore, she states that when she voids she feels a burning sensation in her vaginal area. She states her urine is dark and bloody appearing. Patient attempted to contact her OB/GYN regarding her symptoms and states that "no one answered all day". She denies associated fever, chest pain or shortness of breath, diarrhea, melena or hematochezia, vaginal bleeding, numbness/tingling, and weakness.  [facility] - [facility]  The history is provided by the patient. No language interpreter was used.    Past Medical History  Diagnosis Date  . Bipolar 1 disorder   . Anxiety   . Asthma   . Bipolar 1 disorder   . Anxiety    Past Surgical History  Procedure Laterality Date  . Tonsillectomy    . Cervical biopsy  w/ loop electrode excision    . Leep     No family history on file. History  Substance Use Topics  . Smoking status: Current Every Day Smoker -- 0.50 packs/day  . Smokeless tobacco: Not on file  . Alcohol Use: No   OB History   Grav Para Term Preterm Abortions TAB SAB Ect Mult Living   1              Review of Systems  Constitutional: Negative for fever.  Respiratory: Negative for shortness of breath.   Cardiovascular: Negative  for chest pain.  Gastrointestinal: Positive for nausea, vomiting and abdominal pain.  Genitourinary: Positive for pelvic pain. Negative for vaginal bleeding.  Neurological: Negative for syncope.  All other systems reviewed and are negative.     Allergies  Amoxicillin-pot clavulanate and Codeine  Home Medications   Current Outpatient Rx  Name  Route  Sig  Dispense  Refill  . BusPIRone HCl (BUSPAR PO)   Oral   Take by mouth.         Marland Kitchen GABAPENTIN PO   Oral   Take by mouth.         . Sertraline HCl (ZOLOFT PO)   Oral   Take by mouth.         Marland Kitchen albuterol (PROVENTIL HFA;VENTOLIN HFA) 108 (90 BASE) MCG/ACT inhaler   Inhalation   Inhale 2 puffs into the lungs every 6 (six) hours as needed. For shortness of breath or wheezing          . albuterol (PROVENTIL HFA;VENTOLIN HFA) 108 (90 BASE) MCG/ACT inhaler   Inhalation   Inhale 2 puffs into the lungs every 2 (two) hours as needed for wheezing or shortness of breath (cough).   1 Inhaler   0   . cephALEXin (KEFLEX) 500 MG capsule   Oral   Take 1 capsule (500 mg total) by mouth 4 (four) times daily.   40 capsule   0   . levonorgestrel (MIRENA) 20 MCG/24HR IUD   Intrauterine  1 each by Intrauterine route once. Inserted May 2008          . metroNIDAZOLE (FLAGYL) 500 MG tablet      Take one tablet PO bid for infection. (Start this medication after your nausea and vomiting has stopped).   14 tablet   0   . metroNIDAZOLE (FLAGYL) 500 MG tablet   Oral   Take 1 tablet (500 mg total) by mouth 2 (two) times daily.   14 tablet   0   . neomycin-polymyxin-hydrocortisone (CORTISPORIN) 3.5-10000-1 otic suspension   Otic   Place 4 drops in ear(s) 4 (four) times daily. X 7 days   10 mL   0   . ondansetron (ZOFRAN ODT) 8 MG disintegrating tablet   Oral   Take 1 tablet (8 mg total) by mouth every 8 (eight) hours as needed for nausea or vomiting.   20 tablet   0   . ondansetron (ZOFRAN) 4 MG tablet   Oral   Take 1  tablet (4 mg total) by mouth every 6 (six) hours.   12 tablet   0   . oseltamivir (TAMIFLU) 75 MG capsule   Oral   Take 1 capsule (75 mg total) by mouth every 12 (twelve) hours.   10 capsule   0   . oxyCODONE-acetaminophen (PERCOCET) 5-325 MG per tablet   Oral   Take 2 tablets by mouth every 4 (four) hours as needed for pain.   10 tablet   0   . predniSONE (DELTASONE) 20 MG tablet      2 tabs po daily x 4 days   8 tablet   0   . promethazine (PHENERGAN) 12.5 MG suppository   Rectal   Place 2 suppositories (25 mg total) rectally every 6 (six) hours as needed for nausea.   6 each   0   . EXPIRED: promethazine (PHENERGAN) 25 MG tablet   Oral   Take 1 tablet (25 mg total) by mouth every 6 (six) hours as needed for nausea.   15 tablet   0   . sulfamethoxazole-trimethoprim (BACTRIM DS,SEPTRA DS) 800-160 MG per tablet   Oral   Take 1 tablet by mouth 2 (two) times daily. One po bid x 7 days   14 tablet   0    BP 158/99  Pulse 92  Temp(Src) 98.5 F (36.9 C) (Oral)  Resp 18  Ht 5\' 3"  (1.6 m)  Wt 205 lb (92.987 kg)  BMI 36.32 kg/m2  SpO2 100%  LMP 07/02/2012  Physical Exam  Nursing note and vitals reviewed. Constitutional: She is oriented to person, place, and time. She appears well-developed and well-nourished. No distress.  HENT:  Head: Normocephalic and atraumatic.  Mouth/Throat: Oropharynx is clear and moist. No oropharyngeal exudate.  Eyes: Conjunctivae and EOM are normal. Pupils are equal, round, and reactive to light. No scleral icterus.  Neck: Normal range of motion.  Cardiovascular: Normal rate, regular rhythm and normal heart sounds.   Pulmonary/Chest: Effort normal and breath sounds normal. No respiratory distress. She has no wheezes. She has no rales.  Abdominal: Soft. Normal appearance. She exhibits no distension and no mass. There is tenderness (lower abdominal and suprapubic) in the right lower quadrant, suprapubic area and left lower quadrant. There  is no rebound, no guarding, no tenderness at McBurney's point and negative Murphy's sign.    No peritoneal signs  Genitourinary: There is no rash, tenderness, lesion or injury on the right labia. There is no rash, tenderness,  lesion or injury on the left labia. Vaginal discharge (White foul smelling discharge) found.  Sutures post vaginal delivery intact without erythema or heat to touch. No distinct purulent drainage from site appreciated. TTP appreciated to be appropriate given recent vaginal delivery.  Musculoskeletal: Normal range of motion.  Neurological: She is alert and oriented to person, place, and time.  Skin: Skin is warm and dry. No rash noted. She is not diaphoretic. No erythema. No pallor.  Psychiatric: She has a normal mood and affect. Her behavior is normal.    ED Course  Procedures (including critical care time) Labs Review Labs Reviewed  CBC - Abnormal; Notable for the following:    RBC 3.65 (*)    RDW 11.4 (*)    All other components within normal limits  URINALYSIS, ROUTINE W REFLEX MICROSCOPIC - Abnormal; Notable for the following:    APPearance TURBID (*)    Hgb urine dipstick LARGE (*)    Bilirubin Urine SMALL (*)    Protein, ur 30 (*)    Leukocytes, UA LARGE (*)    All other components within normal limits  URINE MICROSCOPIC-ADD ON - Abnormal; Notable for the following:    Squamous Epithelial / LPF MANY (*)    Bacteria, UA MANY (*)    All other components within normal limits  BASIC METABOLIC PANEL   Imaging Review [place] Pelvis Complete  04/01/2013   CLINICAL DATA:  28 year old recently postpartum [name] status post vaginal delivery 03/22/2013. Abdominal pain. Initial encounter.  EXAM: TRANSABDOMINAL ULTRASOUND OF PELVIS  TECHNIQUE: Transabdominal ultrasound examination of the pelvis was performed including evaluation of the uterus, ovaries, adnexal regions, and pelvic cul-de-sac.  COMPARISON:  [facility] less than 14 week 0 be ultrasound  09/19/2012.  FINDINGS: Uterus  Measurements: 10.3 x 6.6 x 10.0 cm. Myometrium within normal limits in the recent postpartum state. No fibroids or other mass visualized.  Endometrium  Thickness: Up to 2 mm, with mixed echogenicity fluid and small volume echogenic debris. Doppler ultrasound evaluation does reveal an area of suspicious hypervascularity near the echogenic component (image 20).  Right ovary  Could not be visualized despite multiple attempts due to overlying bowel gas.  Left ovary  Could not be visualized despite multiple attempts due to overlying bowel gas.  Other findings:  Trace simple appearing free fluid.  No spectral Doppler performed. Transvaginal imaging defer due to recent repair of vaginal tear related to delivery.  IMPRESSION: 1. Heterogeneous endometrial canal suggesting some fluid and echogenic debris. Hypervascularity in proximity to the echogenic area (see image 20), cannot exclude small volume retained products of conception. 2. Trace simple appearing pelvic free fluid. Myometrium within normal limits. 3. Ovaries could not be visualizeddue to overlying bowel gas, despite multiple attempts.   Electronically Signed   By: Augusto GambleLee  Hall M.D.   On: 04/01/2013 19:11   [place] Art/ven Flow Abd Pelv Doppler  04/01/2013   CLINICAL DATA:  28 year old recently postpartum [name] status post vaginal delivery 03/22/2013. Abdominal pain. Initial encounter.  EXAM: TRANSABDOMINAL ULTRASOUND OF PELVIS  TECHNIQUE: Transabdominal ultrasound examination of the pelvis was performed including evaluation of the uterus, ovaries, adnexal regions, and pelvic cul-de-sac.  COMPARISON:  [facility] less than 14 week 0 be ultrasound 09/19/2012.  FINDINGS: Uterus  Measurements: 10.3 x 6.6 x 10.0 cm. Myometrium within normal limits in the recent postpartum state. No fibroids or other mass visualized.  Endometrium  Thickness: Up to 2 mm, with mixed echogenicity fluid and small volume echogenic  debris. Doppler  ultrasound evaluation does reveal an area of suspicious hypervascularity near the echogenic component (image 20).  Right ovary  Could not be visualized despite multiple attempts due to overlying bowel gas.  Left ovary  Could not be visualized despite multiple attempts due to overlying bowel gas.  Other findings:  Trace simple appearing free fluid.  No spectral Doppler performed. Transvaginal imaging defer due to recent repair of vaginal tear related to delivery.  IMPRESSION: 1. Heterogeneous endometrial canal suggesting some fluid and echogenic debris. Hypervascularity in proximity to the echogenic area (see image 20), cannot exclude small volume retained products of conception. 2. Trace simple appearing pelvic free fluid. Myometrium within normal limits. 3. Ovaries could not be visualizeddue to overlying bowel gas, despite multiple attempts.   Electronically Signed   By: Augusto Gamble M.D.   On: 04/01/2013 19:11    EKG Interpretation   None       MDM   Final diagnoses:  Abdominal pain  Nausea and vomiting  Trichomonal urethritis    28 year old [name] presents to the emergency department for vaginal pain and lower abdominal pain secondary to vaginal delivery on 03/22/2013. Patient is well and nontoxic appearing, hemodynamically stable, is afebrile. Physical exam significant for bilateral lower abdominal and suprapubic tenderness to palpation without peritoneal signs. Abdomen is soft without guarding. Patient also noted to have intact sutures to her vaginal canal without obvious erythema, swelling, heat to touch, or purulent drainage. Will further evaluate his labs as well as pelvic ultrasound to evaluate for retained POC.  1930 - Patient without leukocytosis today. She has no anemia or electrolyte imbalance. Kidney function preserved. Urinalysis suggestive of contamination and trichomonal urethritis. Ultrasound shows a heterogeneous endometrial canal with echogenic debris suggesting potential  retention of products of conception. Will consult with [facility] regarding ultrasound findings.  2045 - Have spoken with Dr. Cliffton Asters of [facility] regarding work up and imaging results including possibility of retained products of conception. Dr. Cliffton Asters recommends Keflex 500mg  4 times a day with an office followup tomorrow. Dr Cliffton Asters does not see the need for admission given patient's hemodynamic stability, lack of fever and emesis, and lack of leukocytosis. She has also advised the patient be given a prescription for Flagyl for her Trichomonas, but to have the patient refrain from beginning this medication until after her followup. Plan discussed with patient who is agreeable with no unaddressed concerns. She is hemodynamically stable and appropriate for discharge.   Filed Vitals:   04/01/13 1610  BP: 158/99  Pulse: 92  Temp: 98.5 F (36.9 C)  TempSrc: Oral  Resp: 18  Height: 5\' 3"  (1.6 m)  Weight: 205 lb (92.987 kg)  SpO2: 100%     Antony Madura, PA-C 02/Kimberly/15 [phone]

## 2013-04-01 NOTE — ED Notes (Signed)
Patient transported to Ultrasound 

## 2013-04-01 NOTE — Discharge Instructions (Signed)
Take Keflex as prescribed for symptoms. Takes Zofran as needed for nausea/vomiting. Recommend Tylenol for pain control. Followup with your OB/GYN tomorrow. Call the office in the morning to schedule your follow up appointment. You have been given a prescription for Flagyl today for trichomonal urethritis. Do not begin taking this until you followup with your OB/GYN.  Abdominal Pain, Women Abdominal (stomach, pelvic, or belly) pain can be caused by many things. It is important to tell your doctor:  The location of the pain.  Does it come and go or is it present all the time?  Are there things that start the pain (eating certain foods, exercise)?  Are there other symptoms associated with the pain (fever, nausea, vomiting, diarrhea)? All of this is helpful to know when trying to find the cause of the pain. CAUSES   Stomach: virus or bacteria infection, or ulcer.  Intestine: appendicitis (inflamed appendix), regional ileitis (Crohn's disease), ulcerative colitis (inflamed colon), irritable bowel syndrome, diverticulitis (inflamed diverticulum of the colon), or cancer of the stomach or intestine.  Gallbladder disease or stones in the gallbladder.  Kidney disease, kidney stones, or infection.  Pancreas infection or cancer.  Fibromyalgia (pain disorder).  Diseases of the female organs:  Uterus: fibroid (non-cancerous) tumors or infection.  Fallopian tubes: infection or tubal pregnancy.  Ovary: cysts or tumors.  Pelvic adhesions (scar tissue).  Endometriosis (uterus lining tissue growing in the pelvis and on the pelvic organs).  Pelvic congestion syndrome (female organs filling up with blood just before the menstrual period).  Pain with the menstrual period.  Pain with ovulation (producing an egg).  Pain with an IUD (intrauterine device, birth control) in the uterus.  Cancer of the female organs.  Functional pain (pain not caused by a disease, may improve without  treatment).  Psychological pain.  Depression. DIAGNOSIS  Your doctor will decide the seriousness of your pain by doing an examination.  Blood tests.  X-rays.  Ultrasound.  CT scan (computed tomography, special type of X-ray).  MRI (magnetic resonance imaging).  Cultures, for infection.  Barium enema (dye inserted in the large intestine, to better view it with X-rays).  Colonoscopy (looking in intestine with a lighted tube).  Laparoscopy (minor surgery, looking in abdomen with a lighted tube).  Major abdominal exploratory surgery (looking in abdomen with a large incision). TREATMENT  The treatment will depend on the cause of the pain.   Many cases can be observed and treated at home.  Over-the-counter medicines recommended by your caregiver.  Prescription medicine.  Antibiotics, for infection.  Birth control pills, for painful periods or for ovulation pain.  Hormone treatment, for endometriosis.  Nerve blocking injections.  Physical therapy.  Antidepressants.  Counseling with a psychologist or psychiatrist.  Minor or major surgery. HOME CARE INSTRUCTIONS   Do not take laxatives, unless directed by your caregiver.  Take over-the-counter pain medicine only if ordered by your caregiver. Do not take aspirin because it can cause an upset stomach or bleeding.  Try a clear liquid diet (broth or water) as ordered by your caregiver. Slowly move to a bland diet, as tolerated, if the pain is related to the stomach or intestine.  Have a thermometer and take your temperature several times a day, and record it.  Bed rest and sleep, if it helps the pain.  Avoid sexual intercourse, if it causes pain.  Avoid stressful situations.  Keep your follow-up appointments and tests, as your caregiver orders.  If the pain does not go away with  medicine or surgery, you may try:  Acupuncture.  Relaxation exercises (yoga, meditation).  Group therapy.  Counseling. SEEK  MEDICAL CARE IF:   You notice certain foods cause stomach pain.  Your home care treatment is not helping your pain.  You need stronger pain medicine.  You want your IUD removed.  You feel faint or lightheaded.  You develop nausea and vomiting.  You develop a rash.  You are having side effects or an allergy to your medicine. SEEK IMMEDIATE MEDICAL CARE IF:   Your pain does not go away or gets worse.  You have a fever.  Your pain is felt only in portions of the abdomen. The right side could possibly be appendicitis. The left lower portion of the abdomen could be colitis or diverticulitis.  You are passing blood in your stools (bright red or black tarry stools, with or without vomiting).  You have blood in your urine.  You develop chills, with or without a fever.  You pass out. MAKE SURE YOU:   Understand these instructions.  Will watch your condition.  Will get help right away if you are not doing well or get worse. Document Released: 11/25/2006 Document Revised: 04/22/2011 Document Reviewed: 12/15/2008 [facility] Patient Information 2014 [place], [place].

## 2013-04-01 NOTE — ED Notes (Signed)
PA at bedside.

## 2013-04-01 NOTE — ED Notes (Signed)
C/o vaginal pain where sutures placed after vaginal delivery 03/22/13-also c/o abd pain and n/v-states she called her [facility] "couldn't get no body to answer"

## 2013-04-01 NOTE — ED Notes (Signed)
pa at bedside. 

## 2013-04-03 NOTE — ED Provider Notes (Signed)
Medical screening examination/treatment/procedure(s) were performed by non-physician practitioner and as supervising physician I was immediately available for consultation/collaboration.     Geoffery Lyonsouglas Elizardo Chilson, MD 04/03/13 [phone]

## 2013-04-27 ENCOUNTER — Emergency Department (HOSPITAL_COMMUNITY)
Admission: EM | Admit: 2013-04-27 | Discharge: 2013-04-28 | Disposition: A | Discharge status: Home / Self Care | Payer: Medicare PPO | Attending: Emergency Medicine | Admitting: Emergency Medicine

## 2013-04-27 ENCOUNTER — Encounter (HOSPITAL_COMMUNITY): Payer: Self-pay | Admitting: Emergency Medicine

## 2013-04-27 DIAGNOSIS — Y929 Unspecified place or not applicable: Secondary | ICD-10-CM | POA: Insufficient documentation

## 2013-04-27 DIAGNOSIS — F319 Bipolar disorder, unspecified: Secondary | ICD-10-CM | POA: Insufficient documentation

## 2013-04-27 DIAGNOSIS — F17200 Nicotine dependence, unspecified, uncomplicated: Secondary | ICD-10-CM | POA: Insufficient documentation

## 2013-04-27 DIAGNOSIS — Y939 Activity, unspecified: Secondary | ICD-10-CM | POA: Insufficient documentation

## 2013-04-27 DIAGNOSIS — IMO0002 Reserved for concepts with insufficient information to code with codable children: Secondary | ICD-10-CM | POA: Insufficient documentation

## 2013-04-27 DIAGNOSIS — F411 Generalized anxiety disorder: Secondary | ICD-10-CM | POA: Insufficient documentation

## 2013-04-27 DIAGNOSIS — X58XXXA Exposure to other specified factors, initial encounter: Secondary | ICD-10-CM | POA: Insufficient documentation

## 2013-04-27 DIAGNOSIS — S46811A Strain of other muscles, fascia and tendons at shoulder and upper arm level, right arm, initial encounter: Secondary | ICD-10-CM

## 2013-04-27 DIAGNOSIS — Z880 Allergy status to penicillin: Secondary | ICD-10-CM | POA: Insufficient documentation

## 2013-04-27 DIAGNOSIS — J45909 Unspecified asthma, uncomplicated: Secondary | ICD-10-CM | POA: Insufficient documentation

## 2013-04-27 DIAGNOSIS — Z79899 Other long term (current) drug therapy: Secondary | ICD-10-CM | POA: Insufficient documentation

## 2013-04-27 NOTE — ED Notes (Signed)
Pt. reports right shoulder joint /muscle pain radiating to right upper back worse with movement / certain positions .

## 2013-04-27 NOTE — ED Provider Notes (Signed)
CSN: [id number]     Arrival date & time 04/27/13  2256 History  This chart was scribed for non-physician practitioner, Jaynie Crumbleatyana Mirakle Tomlin, PA-C working with Hurman HornJohn M Bednar, MD by Greggory StallionKayla Andersen, ED scribe. This patient was seen in room TR06C/TR06C and the patient's care was started at 11:53 PM.   Chief Complaint  Patient presents with  . Shoulder Pain   The history is provided by the patient. No language interpreter was used.   HPI Comments: [name] is a 28 y.o. female who presents to the Emergency Department complaining of gradual onset, worsening right shoulder pain that radiates into her upper back that started after having a baby about one month ago. Turning her neck and lifting her arm worsen the pain. Pt called her OB-GYN and was told to take her gabapentin with tylenol and use a heating pad. She states this has provided little relief.   Past Medical History  Diagnosis Date  . Bipolar 1 disorder   . Anxiety   . Asthma   . Bipolar 1 disorder   . Anxiety    Past Surgical History  Procedure Laterality Date  . Tonsillectomy    . Cervical biopsy  w/ loop electrode excision    . Leep     No family history on file. History  Substance Use Topics  . Smoking status: Current Every Day Smoker -- 0.50 packs/day  . Smokeless tobacco: Not on file  . Alcohol Use: No   OB History   Grav Para Term Preterm Abortions TAB SAB Ect Mult Living   1              Review of Systems  Musculoskeletal: Positive for arthralgias and back pain.  All other systems reviewed and are negative.   Allergies  Amoxicillin-pot clavulanate and Codeine  Home Medications   Current Outpatient Rx  Name  Route  Sig  Dispense  Refill  . albuterol (PROVENTIL HFA;VENTOLIN HFA) 108 (90 BASE) MCG/ACT inhaler   Inhalation   Inhale 2 puffs into the lungs every 6 (six) hours as needed. For shortness of breath or wheezing          . albuterol (PROVENTIL HFA;VENTOLIN HFA) 108 (90 BASE) MCG/ACT inhaler  Inhalation   Inhale 2 puffs into the lungs every 2 (two) hours as needed for wheezing or shortness of breath (cough).   1 Inhaler   0   . BusPIRone HCl (BUSPAR PO)   Oral   Take by mouth.         . cephALEXin (KEFLEX) 500 MG capsule   Oral   Take 1 capsule (500 mg total) by mouth 4 (four) times daily.   40 capsule   0   . GABAPENTIN PO   Oral   Take by mouth.         . levonorgestrel (MIRENA) 20 MCG/24HR IUD   Intrauterine   1 each by Intrauterine route once. Inserted May 2008          . metroNIDAZOLE (FLAGYL) 500 MG tablet      Take one tablet PO bid for infection. (Start this medication after your nausea and vomiting has stopped).   14 tablet   0   . metroNIDAZOLE (FLAGYL) 500 MG tablet   Oral   Take 1 tablet (500 mg total) by mouth 2 (two) times daily.   14 tablet   0   . neomycin-polymyxin-hydrocortisone (CORTISPORIN) 3.5-10000-1 otic suspension   Otic   Place 4 drops in ear(s)  4 (four) times daily. X 7 days   10 mL   0   . ondansetron (ZOFRAN ODT) 8 MG disintegrating tablet   Oral   Take 1 tablet (8 mg total) by mouth every 8 (eight) hours as needed for nausea or vomiting.   20 tablet   0   . ondansetron (ZOFRAN) 4 MG tablet   Oral   Take 1 tablet (4 mg total) by mouth every 6 (six) hours.   12 tablet   0   . oseltamivir (TAMIFLU) 75 MG capsule   Oral   Take 1 capsule (75 mg total) by mouth every 12 (twelve) hours.   10 capsule   0   . oxyCODONE-acetaminophen (PERCOCET) 5-325 MG per tablet   Oral   Take 2 tablets by mouth every 4 (four) hours as needed for pain.   10 tablet   0   . predniSONE (DELTASONE) 20 MG tablet      2 tabs po daily x 4 days   8 tablet   0   . promethazine (PHENERGAN) 12.5 MG suppository   Rectal   Place 2 suppositories (25 mg total) rectally every 6 (six) hours as needed for nausea.   6 each   0   . EXPIRED: promethazine (PHENERGAN) 25 MG tablet   Oral   Take 1 tablet (25 mg total) by mouth every 6 (six)  hours as needed for nausea.   15 tablet   0   . Sertraline HCl (ZOLOFT PO)   Oral   Take by mouth.         . sulfamethoxazole-trimethoprim (BACTRIM DS,SEPTRA DS) 800-160 MG per tablet   Oral   Take 1 tablet by mouth 2 (two) times daily. One po bid x 7 days   14 tablet   0    BP 127/80  Pulse 86  Temp(Src) 98.8 F (37.1 C) (Oral)  Resp 14  Ht 5\' 3"  (1.6 m)  Wt 187 lb (84.823 kg)  BMI 33.13 kg/m2  SpO2 100%  LMP 07/02/2012  Breastfeeding? Unknown  Physical Exam  Nursing note and vitals reviewed. Constitutional: She is oriented to person, place, and time. She appears well-developed and well-nourished. No distress.  HENT:  Head: Normocephalic and atraumatic.  Eyes: EOM are normal.  Neck: Neck supple. No tracheal deviation present.  Cardiovascular: Normal rate.   Pulmonary/Chest: Effort normal. No respiratory distress.  Musculoskeletal: Normal range of motion.  Normal appearing right shoulder with no rashes, swelling or redness. Tender to palpation over trapezius muscle starting at the base of the right skull extending down the neck into the right shoulder and down to the periscapular area. Area is tender to palpation. Pain with any range of motion of the right arm. Strength of the bicep, tricep intact. There is no bony shoulder or scapular tenderness. Grip strength is normal and equal bilaterally. Sensation is normal in all dermatomes of the right arm.  Neurological: She is alert and oriented to person, place, and time.  Skin: Skin is warm and dry.  Psychiatric: She has a normal mood and affect. Her behavior is normal.    ED Course  Procedures (including critical care time)  DIAGNOSTIC STUDIES: Oxygen Saturation is 100% on RA, normal by my interpretation.    COORDINATION OF CARE: 11:57 PM-Discussed treatment plan which includes a muscle relaxer with pt at bedside and pt agreed to plan.   Labs Review Labs Reviewed - No data to display Imaging Review No results  found.  EKG Interpretation None      MDM   Final diagnoses:  Strain of right trapezius muscle    Pt with pain over right trapezius muscle. I suspect her pain is muscular, and could be due to muscle spasms. Patient does have a newborn child  that she picked up with a right arm. There is no indications for further imaging at this time. Will add valium. recommended stretching, massage, continue gabapentin and tylenol. Follow up with her doctor.   Filed Vitals:   04/27/13 2308  BP: 127/80  Pulse: 86  Temp: 98.8 F (37.1 C)  TempSrc: Oral  Resp: 14  Height: 5\' 3"  (1.6 m)  Weight: 187 lb (84.823 kg)  SpO2: 100%     I personally performed the services described in this documentation, which was scribed in my presence. The recorded information has been reviewed and is accurate.   Lottie Mussel, PA-C 04/28/13 [phone]

## 2013-04-28 MED ORDER — DIAZEPAM 5 MG PO TABS
5.0000 mg | ORAL_TABLET | Freq: Once | ORAL | Status: AC
  Administered 2013-04-28: 5 mg via ORAL
  Filled 2013-04-28: qty 1

## 2013-04-28 MED ORDER — DIAZEPAM 5 MG PO TABS: 5.0000 mg | ORAL_TABLET | Freq: Three times a day (TID) | ORAL | Status: DC | PRN

## 2013-04-28 NOTE — ED Notes (Signed)
Pain med given at time of d/c, alert, NAD, calm, interactive, steady gait, family here with pt, given Rx x1.

## 2013-04-28 NOTE — Discharge Instructions (Signed)
Continue gabapentin and Tylenol for your pain. Continue heating pads. Stretch your neck and shoulder several times a day, I would also recommend getting a massage. Valium for muscle spasms. Followup with a primary care doctor.    Muscle Strain A muscle strain is an injury that occurs when a muscle is stretched beyond its normal length. Usually a small number of muscle fibers are torn when this happens. Muscle strain is rated in degrees. First-degree strains have the least amount of muscle fiber tearing and pain. Second-degree and third-degree strains have increasingly more tearing and pain.  Usually, recovery from muscle strain takes 1 2 weeks. Complete healing takes 5 6 weeks.  CAUSES  Muscle strain happens when a sudden, violent force placed on a muscle stretches it too far. This may occur with lifting, sports, or a fall.  RISK FACTORS Muscle strain is especially common in athletes.  SIGNS AND SYMPTOMS At the site of the muscle strain, there may be:  Pain.  Bruising.  Swelling.  Difficulty using the muscle due to pain or lack of normal function. DIAGNOSIS  Your health care provider will perform a physical exam and ask about your medical history. TREATMENT  Often, the best treatment for a muscle strain is resting, icing, and applying cold compresses to the injured area.  HOME CARE INSTRUCTIONS   Use the PRICE method of treatment to promote muscle healing during the first 2 3 days after your injury. The PRICE method involves:  Protecting the muscle from being injured again.  Restricting your activity and resting the injured body part.  Icing your injury. To do this, put ice in a plastic bag. Place a towel between your skin and the bag. Then, apply the ice and leave it on from 15 20 minutes each hour. After the third day, switch to moist heat packs.  Apply compression to the injured area with a splint or elastic bandage. Be careful not to wrap it too tightly. This may interfere with  blood circulation or increase swelling.  Elevate the injured body part above the level of your heart as often as you can.  Only take over-the-counter or prescription medicines for pain, discomfort, or fever as directed by your health care provider.  Warming up prior to exercise helps to prevent future muscle strains. SEEK MEDICAL CARE IF:   You have increasing pain or swelling in the injured area.  You have numbness, tingling, or a significant loss of strength in the injured area. MAKE SURE YOU:   Understand these instructions.  Will watch your condition.  Will get help right away if you are not doing well or get worse. Document Released: 01/28/2005 Document Revised: 11/18/2012 Document Reviewed: 08/27/2012 [facility] Patient Information 2014 [place]

## 2013-04-30 NOTE — ED Provider Notes (Signed)
Medical screening examination/treatment/procedure(s) were performed by non-physician practitioner and as supervising physician I was immediately available for consultation/collaboration.   Yulonda Wheeling M Dorathea Faerber, MD 04/30/13 1007 

## 2013-05-21 ENCOUNTER — Emergency Department (HOSPITAL_BASED_OUTPATIENT_CLINIC_OR_DEPARTMENT_OTHER)
Admission: EM | Admit: 2013-05-21 | Discharge: 2013-05-21 | Disposition: A | Discharge status: Home / Self Care | Payer: Medicare PPO | Attending: Emergency Medicine | Admitting: Emergency Medicine

## 2013-05-21 ENCOUNTER — Encounter (HOSPITAL_BASED_OUTPATIENT_CLINIC_OR_DEPARTMENT_OTHER): Payer: Self-pay | Admitting: Emergency Medicine

## 2013-05-21 DIAGNOSIS — F411 Generalized anxiety disorder: Secondary | ICD-10-CM | POA: Insufficient documentation

## 2013-05-21 DIAGNOSIS — K032 Erosion of teeth: Secondary | ICD-10-CM | POA: Insufficient documentation

## 2013-05-21 DIAGNOSIS — K0889 Other specified disorders of teeth and supporting structures: Secondary | ICD-10-CM

## 2013-05-21 DIAGNOSIS — F319 Bipolar disorder, unspecified: Secondary | ICD-10-CM | POA: Insufficient documentation

## 2013-05-21 DIAGNOSIS — K029 Dental caries, unspecified: Secondary | ICD-10-CM | POA: Insufficient documentation

## 2013-05-21 DIAGNOSIS — Z880 Allergy status to penicillin: Secondary | ICD-10-CM | POA: Insufficient documentation

## 2013-05-21 DIAGNOSIS — K056 Periodontal disease, unspecified: Secondary | ICD-10-CM | POA: Insufficient documentation

## 2013-05-21 DIAGNOSIS — K089 Disorder of teeth and supporting structures, unspecified: Secondary | ICD-10-CM | POA: Insufficient documentation

## 2013-05-21 DIAGNOSIS — K069 Disorder of gingiva and edentulous alveolar ridge, unspecified: Secondary | ICD-10-CM

## 2013-05-21 DIAGNOSIS — F17200 Nicotine dependence, unspecified, uncomplicated: Secondary | ICD-10-CM | POA: Insufficient documentation

## 2013-05-21 DIAGNOSIS — J45909 Unspecified asthma, uncomplicated: Secondary | ICD-10-CM | POA: Insufficient documentation

## 2013-05-21 MED ORDER — PENICILLIN V POTASSIUM 500 MG PO TABS: 500.0000 mg | ORAL_TABLET | Freq: Three times a day (TID) | ORAL | Status: DC

## 2013-05-21 MED ORDER — OXYCODONE-ACETAMINOPHEN 5-325 MG PO TABS
1.0000 | ORAL_TABLET | Freq: Once | ORAL | Status: AC
  Administered 2013-05-21: 1 via ORAL
  Filled 2013-05-21: qty 1

## 2013-05-21 MED ORDER — PENICILLIN V POTASSIUM 250 MG PO TABS
500.0000 mg | ORAL_TABLET | Freq: Once | ORAL | Status: AC
  Administered 2013-05-21: 500 mg via ORAL
  Filled 2013-05-21: qty 2

## 2013-05-21 MED ORDER — OXYCODONE-ACETAMINOPHEN 5-325 MG PO TABS: 1.0000 | ORAL_TABLET | ORAL | Status: DC | PRN

## 2013-05-21 NOTE — ED Provider Notes (Signed)
Medical screening examination/treatment/procedure(s) were performed by non-physician practitioner and as supervising physician I was immediately available for consultation/collaboration.   EKG Interpretation None        [name] B. Bernette MayersSheldon, MD 05/21/13 [phone]
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
## 2013-05-21 NOTE — Discharge Instructions (Signed)
Dental Caries   Dental caries (also called tooth decay) is the most common oral disease. It can occur at any age, but is more common in children and young adults.   HOW DENTAL CARIES DEVELOPS   The process of decay begins when bacteria and foods (particularly sugars and starches) combine in your mouth to produce plaque. Plaque is a substance that sticks to the hard, outer surface of a tooth (enamel). The bacteria in plaque produce acids that attack enamel. These acids may also attack the root surface of a tooth (cementum) if it is exposed. Repeated attacks dissolve these surfaces and create holes in the tooth (cavities). If left untreated, the acids destroy the other layers of the tooth.   RISK FACTORS  · Frequent sipping of sugary beverages.    · Frequent snacking on sugary and starchy foods, especially those that easily get stuck in the teeth.    · Poor oral hygiene.    · Dry mouth.    · Substance abuse such as methamphetamine abuse.    · Broken or poor-fitting dental restorations.    · Eating disorders.    · Gastroesophageal reflux disease (GERD).    · Certain radiation treatments to the head and neck.  SYMPTOMS  In the early stages of dental caries, symptoms are seldom present. Sometimes white, chalky areas may be seen on the enamel or other tooth layers. In later stages, symptoms may include:  · Pits and holes on the enamel.  · Toothache after sweet, hot, or cold foods or drinks are consumed.  · Pain around the tooth.  · Swelling around the tooth.  DIAGNOSIS   Most of the time, dental caries is detected during a regular dental checkup. A diagnosis is made after a thorough medical and dental history is taken and the surfaces of your teeth are checked for signs of dental caries. Sometimes special instruments, such as lasers, are used to check for dental caries. Dental X-ray exams may be taken so that areas not visible to the eye (such as between the contact areas of the teeth) can be checked for cavities.    TREATMENT   If dental caries is in its early stages, it may be reversed with a fluoride treatment or an application of a remineralizing agent at the dental office. Thorough brushing and flossing at home is needed to aid these treatments. If it is in its later stages, treatment depends on the location and extent of tooth destruction:   · If a small area of the tooth has been destroyed, the destroyed area will be removed and cavities will be filled with a material such as gold, silver amalgam, or composite resin.    · If a large area of the tooth has been destroyed, the destroyed area will be removed and a cap (crown) will be fitted over the remaining tooth structure.    · If the center part of the tooth (pulp) is affected, a procedure called a root canal will be needed before a filling or crown can be placed.    · If most of the tooth has been destroyed, the tooth may need to be pulled (extracted).  HOME CARE INSTRUCTIONS  You can prevent, stop, or reverse dental caries at home by practicing good oral hygiene. Good oral hygiene includes:  · Thoroughly cleaning your teeth at least twice a day with a toothbrush and dental floss.    · Using a fluoride toothpaste. A fluoride mouth rinse may also be used if recommended by your dentist or health care provider.    ·   Restricting the amount of sugary and starchy foods and sugary liquids you consume.    · Avoiding frequent snacking on these foods and sipping of these liquids.    · Keeping regular visits with a dentist for checkups and cleanings.  PREVENTION   · Practice good oral hygiene.  · Consider a dental sealant. A dental sealant is a coating material that is applied by your dentist to the pits and grooves of teeth. The sealant prevents food from being trapped in them. It may protect the teeth for several years.  · Ask about fluoride supplements if you live in a community without fluorinated water or with water that has a low fluoride content. Use fluoride supplements  as directed by your dentist or health care provider.  · Allow fluoride varnish applications to teeth if directed by your dentist or health care provider.  Document Released: 10/20/2001 Document Revised: 09/30/2012 Document Reviewed: 01/31/2012  [employer]® Patient Information ©2014 [employer].
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
## 2013-07-02 DIAGNOSIS — F319 Bipolar disorder, unspecified: Secondary | ICD-10-CM | POA: Insufficient documentation

## 2013-07-02 DIAGNOSIS — F411 Generalized anxiety disorder: Secondary | ICD-10-CM | POA: Insufficient documentation

## 2013-07-02 DIAGNOSIS — K029 Dental caries, unspecified: Secondary | ICD-10-CM | POA: Insufficient documentation

## 2013-07-02 DIAGNOSIS — J45909 Unspecified asthma, uncomplicated: Secondary | ICD-10-CM | POA: Insufficient documentation

## 2013-07-02 DIAGNOSIS — Z791 Long term (current) use of non-steroidal anti-inflammatories (NSAID): Secondary | ICD-10-CM | POA: Insufficient documentation

## 2013-07-02 DIAGNOSIS — Z79899 Other long term (current) drug therapy: Secondary | ICD-10-CM | POA: Insufficient documentation

## 2013-07-02 DIAGNOSIS — F17200 Nicotine dependence, unspecified, uncomplicated: Secondary | ICD-10-CM | POA: Insufficient documentation

## 2013-07-02 MED ORDER — CLINDAMYCIN HCL 300 MG PO CAPS: 300.0000 mg | ORAL_CAPSULE | Freq: Four times a day (QID) | ORAL | Status: AC

## 2013-07-02 MED ORDER — CLINDAMYCIN HCL 150 MG PO CAPS
300.0000 mg | ORAL_CAPSULE | Freq: Once | ORAL | Status: AC
  Administered 2013-07-03: 300 mg via ORAL
  Filled 2013-07-02: qty 2

## 2013-07-02 MED ORDER — MELOXICAM 7.5 MG PO TABS: 7.5000 mg | ORAL_TABLET | Freq: Every day | ORAL | Status: AC

## 2013-07-02 MED ORDER — OXYCODONE-ACETAMINOPHEN 5-325 MG PO TABS
1.0000 | ORAL_TABLET | Freq: Once | ORAL | Status: AC
  Administered 2013-07-03: 1 via ORAL
  Filled 2013-07-02: qty 1

## 2013-07-02 NOTE — Discharge Instructions (Signed)
Dental Care and Dentist Visits Dental care supports good overall health. Regular dental visits can also help you avoid dental pain, bleeding, infection, and other more serious health problems in the future. It is important to keep the mouth healthy because diseases in the teeth, gums, and other oral tissues can spread to other areas of the body. Some problems, such as diabetes, heart disease, and pre-term labor have been associated with poor oral health.  See your dentist every 6 months. If you experience emergency problems such as a toothache or broken tooth, go to the dentist right away. If you see your dentist regularly, you may catch problems early. It is easier to be treated for problems in the early stages.  WHAT TO EXPECT AT A DENTIST VISIT  Your dentist will look for many common oral health problems and recommend proper treatment. At your regular dental visit, you can expect:  Gentle cleaning of the teeth and gums. This includes scraping and polishing. This helps to remove the sticky substance around the teeth and gums (plaque). Plaque forms in the mouth shortly after eating. Over time, plaque hardens on the teeth as tartar. If tartar is not removed regularly, it can cause problems. Cleaning also helps remove stains.  Periodic X-rays. These pictures of the teeth and supporting bone will help your dentist assess the health of your teeth.  Periodic fluoride treatments. Fluoride is a natural mineral shown to help strengthen teeth. Fluoride treatmentinvolves applying a fluoride gel or varnish to the teeth. It is most commonly done in children.  Examination of the mouth, tongue, jaws, teeth, and gums to look for any oral health problems, such as:  Cavities (dental caries). This is decay on the tooth caused by plaque, sugar, and acid in the mouth. It is best to catch a cavity when it is small.  Inflammation of the gums caused by plaque buildup (gingivitis).  Problems with the mouth or malformed  or misaligned teeth.  Oral cancer or other diseases of the soft tissues or jaws. KEEP YOUR TEETH AND GUMS HEALTHY For healthy teeth and gums, follow these general guidelines as well as your dentist's specific advice:  Have your teeth professionally cleaned at the dentist every 6 months.  Brush twice daily with a fluoride toothpaste.  Floss your teeth daily.  Ask your dentist if you need fluoride supplements, treatments, or fluoride toothpaste.  Eat a healthy diet. Reduce foods and drinks with added sugar.  Avoid smoking. TREATMENT FOR ORAL HEALTH PROBLEMS If you have oral health problems, treatment varies depending on the conditions present in your teeth and gums.  Your caregiver will most likely recommend good oral hygiene at each visit.  For cavities, gingivitis, or other oral health disease, your caregiver will perform a procedure to treat the problem. This is typically done at a separate appointment. Sometimes your caregiver will refer you to another dental specialist for specific tooth problems or for surgery. SEEK IMMEDIATE DENTAL CARE IF:  You have pain, bleeding, or soreness in the gum, tooth, jaw, or mouth area.  A permanent tooth becomes loose or separated from the gum socket.  You experience a blow or injury to the mouth or jaw area. Document Released: 10/10/2010 Document Revised: 04/22/2011 Document Reviewed: 10/10/2010 Exit[employer] Patient Information 2014 [employer].  

## 2013-07-02 NOTE — ED Notes (Signed)
Onset x 3 weeks  Dental pain   Is now having dental work done

## 2013-07-03 ENCOUNTER — Encounter (HOSPITAL_BASED_OUTPATIENT_CLINIC_OR_DEPARTMENT_OTHER): Payer: Self-pay | Admitting: Emergency Medicine

## 2013-07-03 ENCOUNTER — Emergency Department (HOSPITAL_BASED_OUTPATIENT_CLINIC_OR_DEPARTMENT_OTHER)
Admission: EM | Admit: 2013-07-03 | Discharge: 2013-07-03 | Disposition: A | Discharge status: Home / Self Care | Payer: Medicare HMO | Attending: Emergency Medicine | Admitting: Emergency Medicine

## 2013-07-03 DIAGNOSIS — K029 Dental caries, unspecified: Secondary | ICD-10-CM

## 2013-07-03 NOTE — ED Provider Notes (Signed)
CSN: [id number]     Arrival date & time 07/02/13  2341 History   First MD Initiated Contact with Patient 07/02/13 2348     Chief Complaint  Patient presents with  . Dental Pain     (Consider location/radiation/quality/duration/timing/severity/associated sxs/prior Treatment) Patient is a 28 y.o. female presenting with tooth pain. The history is provided by the patient.  Dental Pain Location:  Upper Upper teeth location:  2/RU 2nd molar Quality:  Dull Severity:  Severe Onset quality:  Gradual Timing:  Constant Progression:  Unchanged Chronicity:  New Context: poor dentition   Previous work-up:  Dental exam Relieved by:  Nothing Worsened by:  Nothing tried Ineffective treatments:  None tried Associated symptoms: no congestion, no drooling, no facial swelling, no neck pain and no trismus   Risk factors: no alcohol problem     Past Medical History  Diagnosis Date  . Bipolar 1 disorder   . Anxiety   . Asthma   . Bipolar 1 disorder   . Anxiety    Past Surgical History  Procedure Laterality Date  . Tonsillectomy    . Cervical biopsy  w/ loop electrode excision    . Leep     No family history on file. History  Substance Use Topics  . Smoking status: Current Every Day Smoker -- 0.50 packs/day  . Smokeless tobacco: Not on file  . Alcohol Use: No   OB History   Grav Para Term Preterm Abortions TAB SAB Ect Mult Living   1              Review of Systems  HENT: Negative for congestion, drooling and facial swelling.   Musculoskeletal: Negative for neck pain.  All other systems reviewed and are negative.     Allergies  Amoxicillin-pot clavulanate and Codeine  Home Medications   Prior to Admission medications   Medication Sig Start Date End Date Taking? Authorizing Provider  busPIRone (BUSPAR) 15 MG tablet Take 15 mg by mouth 3 (three) times daily.    Historical Provider, MD  clindamycin (CLEOCIN) 300 MG capsule Take 1 capsule (300 mg total) by mouth 4 (four)  times daily. X 7 days 07/02/13   Idalee Foxworthy K Carmel Waddington-Rasch, MD  meloxicam (MOBIC) 7.5 MG tablet Take 1 tablet (7.5 mg total) by mouth daily. 07/02/13   Mariana Wiederholt K Aracely Rickett-Rasch, MD  ondansetron (ZOFRAN ODT) 8 MG disintegrating tablet Take 1 tablet (8 mg total) by mouth every 8 (eight) hours as needed for nausea or vomiting. 02/06/13   Hope Orlene OchM Neese, NP  sertraline (ZOLOFT) 100 MG tablet Take 100 mg by mouth daily.    Historical Provider, MD   BP 144/78  Pulse 92  Temp(Src) 99.1 F (37.3 C) (Oral)  Resp 20  Wt 189 lb (85.73 kg)  SpO2 100% Physical Exam  Constitutional: She is oriented to person, place, and time. She appears well-developed and well-nourished. No distress.  HENT:  Head: Normocephalic and atraumatic.  Mouth/Throat: Oropharynx is clear and moist. No trismus in the jaw. No uvula swelling.    Eyes: Conjunctivae and EOM are normal.  Neck: Normal range of motion. Neck supple.  Cardiovascular: Normal rate, regular rhythm and intact distal pulses.   Pulmonary/Chest: Effort normal and breath sounds normal. She has no wheezes. She has no rales.  Abdominal: Soft. Bowel sounds are normal. There is no tenderness. There is no rebound and no guarding.  Musculoskeletal: Normal range of motion.  Neurological: She is alert and oriented to person, place, and time.  Skin: Skin is warm and dry.  Psychiatric: She has a normal mood and affect.    ED Course  Procedures (including critical care time) Labs Review Labs Reviewed - No data to display  Imaging Review No results found.   EKG Interpretation None      MDM   Final diagnoses:  Dental caries    Will provide Rx for antibiotics, follow up with dentistry for ongoing care    Lesa Vandall K Janesa Dockery-Rasch, MD 07/03/13 0006

## 2013-08-25 ENCOUNTER — Emergency Department (HOSPITAL_BASED_OUTPATIENT_CLINIC_OR_DEPARTMENT_OTHER): Payer: Medicare HMO

## 2013-08-25 ENCOUNTER — Emergency Department (HOSPITAL_BASED_OUTPATIENT_CLINIC_OR_DEPARTMENT_OTHER)
Admission: EM | Admit: 2013-08-25 | Discharge: 2013-08-25 | Disposition: A | Discharge status: Home / Self Care | Payer: Medicare HMO | Attending: Emergency Medicine | Admitting: Emergency Medicine

## 2013-08-25 ENCOUNTER — Encounter (HOSPITAL_BASED_OUTPATIENT_CLINIC_OR_DEPARTMENT_OTHER): Payer: Self-pay | Admitting: Emergency Medicine

## 2013-08-25 DIAGNOSIS — Z880 Allergy status to penicillin: Secondary | ICD-10-CM | POA: Insufficient documentation

## 2013-08-25 DIAGNOSIS — Z79899 Other long term (current) drug therapy: Secondary | ICD-10-CM | POA: Insufficient documentation

## 2013-08-25 DIAGNOSIS — N83209 Unspecified ovarian cyst, unspecified side: Secondary | ICD-10-CM | POA: Insufficient documentation

## 2013-08-25 DIAGNOSIS — N739 Female pelvic inflammatory disease, unspecified: Secondary | ICD-10-CM | POA: Diagnosis not present

## 2013-08-25 DIAGNOSIS — F411 Generalized anxiety disorder: Secondary | ICD-10-CM | POA: Insufficient documentation

## 2013-08-25 DIAGNOSIS — F17200 Nicotine dependence, unspecified, uncomplicated: Secondary | ICD-10-CM | POA: Insufficient documentation

## 2013-08-25 DIAGNOSIS — Z792 Long term (current) use of antibiotics: Secondary | ICD-10-CM | POA: Insufficient documentation

## 2013-08-25 DIAGNOSIS — R6883 Chills (without fever): Secondary | ICD-10-CM | POA: Diagnosis not present

## 2013-08-25 DIAGNOSIS — Z3202 Encounter for pregnancy test, result negative: Secondary | ICD-10-CM | POA: Diagnosis not present

## 2013-08-25 DIAGNOSIS — J45909 Unspecified asthma, uncomplicated: Secondary | ICD-10-CM | POA: Diagnosis not present

## 2013-08-25 DIAGNOSIS — N83202 Unspecified ovarian cyst, left side: Secondary | ICD-10-CM

## 2013-08-25 DIAGNOSIS — Z8659 Personal history of other mental and behavioral disorders: Secondary | ICD-10-CM | POA: Diagnosis not present

## 2013-08-25 DIAGNOSIS — R1032 Left lower quadrant pain: Secondary | ICD-10-CM | POA: Diagnosis present

## 2013-08-25 DIAGNOSIS — A5909 Other urogenital trichomoniasis: Secondary | ICD-10-CM | POA: Diagnosis not present

## 2013-08-25 DIAGNOSIS — N730 Acute parametritis and pelvic cellulitis: Secondary | ICD-10-CM

## 2013-08-25 LAB — CBC
HCT: 41 % (ref 36.0–46.0)
Hemoglobin: 14 g/dL (ref 12.0–15.0)
MCH: 32.1 pg (ref 26.0–34.0)
MCHC: 34.1 g/dL (ref 30.0–36.0)
MCV: 94 fL (ref 78.0–100.0)
PLATELETS: 246 10*3/uL (ref 150–400)
RBC: 4.36 MIL/uL (ref 3.87–5.11)
RDW: 13.3 % (ref 11.5–15.5)
WBC: 6.8 10*3/uL (ref 4.0–10.5)

## 2013-08-25 LAB — URINALYSIS, ROUTINE W REFLEX MICROSCOPIC
Bilirubin Urine: NEGATIVE
GLUCOSE, UA: NEGATIVE mg/dL
Hgb urine dipstick: NEGATIVE
Ketones, ur: NEGATIVE mg/dL
Nitrite: NEGATIVE
PH: 6.5 (ref 5.0–8.0)
Protein, ur: NEGATIVE mg/dL
SPECIFIC GRAVITY, URINE: 1.02 (ref 1.005–1.030)
Urobilinogen, UA: 0.2 mg/dL (ref 0.0–1.0)

## 2013-08-25 LAB — COMPREHENSIVE METABOLIC PANEL
ALBUMIN: 4.7 g/dL (ref 3.5–5.2)
ALT: 7 U/L (ref 0–35)
AST: 13 U/L (ref 0–37)
Alkaline Phosphatase: 64 U/L (ref 39–117)
Anion gap: 16 — ABNORMAL HIGH (ref 5–15)
BUN: 14 mg/dL (ref 6–23)
CO2: 22 mEq/L (ref 19–32)
Calcium: 10.2 mg/dL (ref 8.4–10.5)
Chloride: 102 mEq/L (ref 96–112)
Creatinine, Ser: 0.7 mg/dL (ref 0.50–1.10)
GFR calc Af Amer: >90 mL/min (ref >90)
GFR calc non Af Amer: >90 mL/min (ref >90)
Glucose, Bld: 93 mg/dL (ref 70–99)
Potassium: 4.8 mEq/L (ref 3.7–5.3)
Sodium: 140 mEq/L (ref 137–147)
TOTAL PROTEIN: 8.5 g/dL — AB (ref 6.0–8.3)
Total Bilirubin: 0.9 mg/dL (ref 0.3–1.2)

## 2013-08-25 LAB — WET PREP, GENITAL: YEAST WET PREP: NONE SEEN

## 2013-08-25 LAB — URINE MICROSCOPIC-ADD ON

## 2013-08-25 LAB — PREGNANCY, URINE: Preg Test, Ur: NEGATIVE

## 2013-08-25 LAB — LIPASE, BLOOD: Lipase: 31 U/L (ref 11–59)

## 2013-08-25 MED ORDER — IOHEXOL 300 MG/ML  SOLN
50.0000 mL | Freq: Once | INTRAMUSCULAR | Status: AC | PRN
  Administered 2013-08-25: 50 mL via ORAL

## 2013-08-25 MED ORDER — IOHEXOL 300 MG/ML  SOLN
100.0000 mL | Freq: Once | INTRAMUSCULAR | Status: AC | PRN
  Administered 2013-08-25: 100 mL via INTRAVENOUS

## 2013-08-25 MED ORDER — CEFTRIAXONE SODIUM 250 MG IJ SOLR
250.0000 mg | Freq: Once | INTRAMUSCULAR | Status: AC
  Administered 2013-08-25: 250 mg via INTRAMUSCULAR
  Filled 2013-08-25: qty 250

## 2013-08-25 MED ORDER — MORPHINE SULFATE 4 MG/ML IJ SOLN
4.0000 mg | Freq: Once | INTRAMUSCULAR | Status: AC
  Administered 2013-08-25: 4 mg via INTRAVENOUS
  Filled 2013-08-25: qty 1

## 2013-08-25 MED ORDER — ONDANSETRON HCL 4 MG/2ML IJ SOLN
4.0000 mg | Freq: Once | INTRAMUSCULAR | Status: AC
  Administered 2013-08-25: 4 mg via INTRAVENOUS
  Filled 2013-08-25: qty 2

## 2013-08-25 MED ORDER — SODIUM CHLORIDE 0.9 % IV BOLUS (SEPSIS)
1000.0000 mL | Freq: Once | INTRAVENOUS | Status: AC
  Administered 2013-08-25: 1000 mL via INTRAVENOUS

## 2013-08-25 MED ORDER — METRONIDAZOLE 500 MG PO TABS
2000.0000 mg | ORAL_TABLET | Freq: Once | ORAL | Status: AC
  Administered 2013-08-25: 2000 mg via ORAL
  Filled 2013-08-25: qty 4

## 2013-08-25 MED ORDER — HYDROMORPHONE HCL PF 1 MG/ML IJ SOLN
1.0000 mg | Freq: Once | INTRAMUSCULAR | Status: AC
  Administered 2013-08-25: 1 mg via INTRAVENOUS
  Filled 2013-08-25: qty 1

## 2013-08-25 MED ORDER — DOXYCYCLINE HYCLATE 100 MG PO TABS
100.0000 mg | ORAL_TABLET | Freq: Once | ORAL | Status: AC
  Administered 2013-08-25: 100 mg via ORAL
  Filled 2013-08-25: qty 1

## 2013-08-25 MED ORDER — DOXYCYCLINE HYCLATE 100 MG PO TABS: 100.0000 mg | ORAL_TABLET | Freq: Two times a day (BID) | ORAL | Status: DC

## 2013-08-25 MED ORDER — OXYCODONE-ACETAMINOPHEN 5-325 MG PO TABS: 1.0000 | ORAL_TABLET | Freq: Four times a day (QID) | ORAL | Status: DC | PRN

## 2013-08-25 MED ORDER — AZITHROMYCIN 250 MG PO TABS
1000.0000 mg | ORAL_TABLET | Freq: Once | ORAL | Status: AC
Start: 2013-08-25 — End: 2013-08-25
  Administered 2013-08-25: 1000 mg via ORAL
  Filled 2013-08-25: qty 4

## 2013-08-25 NOTE — ED Notes (Signed)
Pt c/o right lower abd pain with right lower back pain x 5 days

## 2013-08-25 NOTE — ED Provider Notes (Signed)
CSN: [id number]     Arrival date & time 08/25/13  1432 History   First MD Initiated Contact with Patient 08/25/13 1503     Chief Complaint  Patient presents with  . Abdominal Pain     (Consider location/radiation/quality/duration/timing/severity/associated sxs/prior Treatment) Patient is a 28 y.o. female presenting with abdominal pain. The history is provided by the patient.  Abdominal Pain Pain location:  RLQ and suprapubic Pain quality: aching   Pain radiates to:  R flank Pain severity:  Moderate Onset quality:  Gradual Duration:  5 days Timing:  Constant Progression:  Unchanged Chronicity:  New Context: not eating and not sick contacts   Relieved by:  Nothing Worsened by:  Nothing tried Associated symptoms: chills and vomiting   Associated symptoms: no cough, no diarrhea, no dysuria, no fever, no shortness of breath, no vaginal bleeding and no vaginal discharge     Past Medical History  Diagnosis Date  . Bipolar 1 disorder   . Anxiety   . Asthma   . Bipolar 1 disorder   . Anxiety    Past Surgical History  Procedure Laterality Date  . Tonsillectomy    . Cervical biopsy  w/ loop electrode excision    . Leep     History reviewed. No pertinent family history. History  Substance Use Topics  . Smoking status: Current Every Day Smoker -- 0.50 packs/day  . Smokeless tobacco: Not on file  . Alcohol Use: No   OB History   Grav Para Term Preterm Abortions TAB SAB Ect Mult Living   1              Review of Systems  Constitutional: Positive for chills. Negative for fever.  Respiratory: Negative for cough and shortness of breath.   Gastrointestinal: Positive for vomiting and abdominal pain. Negative for diarrhea.  Genitourinary: Negative for dysuria, urgency, vaginal bleeding and vaginal discharge.  All other systems reviewed and are negative.     Allergies  Amoxicillin-pot clavulanate and Codeine  Home Medications   Prior to Admission medications    Medication Sig Start Date End Date Taking? Authorizing Provider  busPIRone (BUSPAR) 15 MG tablet Take 15 mg by mouth 3 (three) times daily.    Historical Provider, MD  clindamycin (CLEOCIN) 300 MG capsule Take 1 capsule (300 mg total) by mouth 4 (four) times daily. X 7 days 07/02/13   April K Palumbo-Rasch, MD  meloxicam (MOBIC) 7.5 MG tablet Take 1 tablet (7.5 mg total) by mouth daily. 07/02/13   April K Palumbo-Rasch, MD  ondansetron (ZOFRAN ODT) 8 MG disintegrating tablet Take 1 tablet (8 mg total) by mouth every 8 (eight) hours as needed for nausea or vomiting. 02/06/13   Hope Orlene Och, NP  sertraline (ZOLOFT) 100 MG tablet Take 100 mg by mouth daily.    Historical Provider, MD   BP 123/75  Pulse 83  Temp(Src) 98.2 F (36.8 C) (Oral)  Resp 16  Ht 5\' 3"  (1.6 m)  Wt 170 lb (77.111 kg)  BMI 30.12 kg/m2  SpO2 100%  LMP 07/11/2013 Physical Exam  Nursing note and vitals reviewed. Constitutional: She is oriented to person, place, and time. She appears well-developed and well-nourished. No distress.  HENT:  Head: Normocephalic and atraumatic.  Mouth/Throat: Oropharynx is clear and moist.  Eyes: EOM are normal. Pupils are equal, round, and reactive to light.  Neck: Normal range of motion. Neck supple.  Cardiovascular: Normal rate and regular rhythm.  Exam reveals no friction rub.   No  murmur heard. Pulmonary/Chest: Effort normal and breath sounds normal. No respiratory distress. She has no wheezes. She has no rales.  Abdominal: Soft. She exhibits no distension. There is tenderness (suprapubic, RLQ). There is no rebound.  Musculoskeletal: Normal range of motion. She exhibits no edema.  Neurological: She is alert and oriented to person, place, and time.  Skin: No rash noted. She is not diaphoretic.    ED Course  Procedures (including critical care time) Labs Review Labs Reviewed  URINALYSIS, ROUTINE W REFLEX MICROSCOPIC - Abnormal; Notable for the following:    Leukocytes, UA SMALL (*)     All other components within normal limits  URINE MICROSCOPIC-ADD ON - Abnormal; Notable for the following:    Bacteria, UA MANY (*)    All other components within normal limits  GC/CHLAMYDIA PROBE AMP  WET PREP, GENITAL  PREGNANCY, URINE  CBC  COMPREHENSIVE METABOLIC PANEL  LIPASE, BLOOD    Imaging Review [place] Transvaginal Non-ob  08/25/2013   CLINICAL DATA:  Pain.  EXAM: TRANSABDOMINAL AND TRANSVAGINAL ULTRASOUND OF PELVIS  DOPPLER ULTRASOUND OF OVARIES  TECHNIQUE: Both transabdominal and transvaginal ultrasound examinations of the pelvis were performed. Transabdominal technique was performed for global imaging of the pelvis including uterus, ovaries, adnexal regions, and pelvic cul-de-sac.  It was necessary to proceed with endovaginal exam following the transabdominal exam to visualize the uterus and ovaries. Color and duplex Doppler ultrasound was utilized to evaluate blood flow to the ovaries.  COMPARISON:  CT 08/25/2013 .  FINDINGS: Uterus  Measurements: 7.5 x 3.7 x 5.6 cm. No fibroids or other mass visualized.  Endometrium  Thickness: 10.7 mm.  No focal abnormality visualized.  Right ovary  Measurements: 3.5 x 2.2 x 2.5 cm. Normal appearance/no adnexal mass.  Left ovary  Measurements: 3.3 x 1.8 x 2.6 cm. Approximately 1 cm cyst left ovary, most likely corpus luteal cyst.  Pulsed Doppler evaluation of both ovaries demonstrates normal low-resistance arterial and venous waveforms.  Other findings  No free fluid.  IMPRESSION: 1. No acute abnormality. 2. Approximately 1 cm cyst left ovary, most likely corpus luteal cyst.   Electronically Signed   By: Maisie Fushomas  Register   On: 08/25/2013 20:31   [place] Pelvis Complete  08/25/2013   CLINICAL DATA:  Pain.  EXAM: TRANSABDOMINAL AND TRANSVAGINAL ULTRASOUND OF PELVIS  DOPPLER ULTRASOUND OF OVARIES  TECHNIQUE: Both transabdominal and transvaginal ultrasound examinations of the pelvis were performed. Transabdominal technique was performed for global imaging of  the pelvis including uterus, ovaries, adnexal regions, and pelvic cul-de-sac.  It was necessary to proceed with endovaginal exam following the transabdominal exam to visualize the uterus and ovaries. Color and duplex Doppler ultrasound was utilized to evaluate blood flow to the ovaries.  COMPARISON:  CT 08/25/2013 .  FINDINGS: Uterus  Measurements: 7.5 x 3.7 x 5.6 cm. No fibroids or other mass visualized.  Endometrium  Thickness: 10.7 mm.  No focal abnormality visualized.  Right ovary  Measurements: 3.5 x 2.2 x 2.5 cm. Normal appearance/no adnexal mass.  Left ovary  Measurements: 3.3 x 1.8 x 2.6 cm. Approximately 1 cm cyst left ovary, most likely corpus luteal cyst.  Pulsed Doppler evaluation of both ovaries demonstrates normal low-resistance arterial and venous waveforms.  Other findings  No free fluid.  IMPRESSION: 1. No acute abnormality. 2. Approximately 1 cm cyst left ovary, most likely corpus luteal cyst.   Electronically Signed   By: Maisie Fushomas  Register   On: 08/25/2013 20:31   Ct Abdomen Pelvis W Contrast  08/25/2013  CLINICAL DATA:  Right lower quadrant pain. History of stone disease.  EXAM: CT ABDOMEN AND PELVIS WITH CONTRAST  TECHNIQUE: Multidetector CT imaging of the abdomen and pelvis was performed using the standard protocol following bolus administration of intravenous contrast.  CONTRAST:  50mL OMNIPAQUE IOHEXOL 300 MG/ML SOLN, OMNIPAQUE IOHEXOL 300 MG/ML SOLN  COMPARISON:  Ultrasound 04/01/2013.  FINDINGS: Liver normal. Spleen normal. Pancreas normal. No biliary distention.  Adrenals and kidneys are unremarkable. No hydronephrosis with right ureteral stone. The bladder is nondistended. Uterus is unremarkable. Tiny left adnexal cyst cannot be excluded. This cyst measures approximate 2 cm and has questionable enhancement, ultrasound can be obtained to further evaluate. No free pelvic fluid.  No significant adenopathy. Abdominal aorta normal in caliber. Visceral branches of the abdominal aorta  unremarkable. Portal vein is patent.  No bowel obstruction. Stool noted throughout the colon. No gastric distention. Esophagogastric region is normal. Duodenum region is normal. No mesenteric mass. Tiny umbilical hernia with herniation fat only. No significant abdominal wall hernia.  Lung bases are clear. Heart size is unremarkable. No acute bony abnormality.  IMPRESSION: 1. 2 cm left adnexal cyst with questionable peripheral enhancement. Ultrasound be obtained further evaluate.  2.  No abnormality otherwise noted.   Electronically Signed   By: Maisie Fus  Register   On: 08/25/2013 18:15   [place] Art/ven Flow Abd Pelv Doppler  08/25/2013   CLINICAL DATA:  Pain.  EXAM: TRANSABDOMINAL AND TRANSVAGINAL ULTRASOUND OF PELVIS  DOPPLER ULTRASOUND OF OVARIES  TECHNIQUE: Both transabdominal and transvaginal ultrasound examinations of the pelvis were performed. Transabdominal technique was performed for global imaging of the pelvis including uterus, ovaries, adnexal regions, and pelvic cul-de-sac.  It was necessary to proceed with endovaginal exam following the transabdominal exam to visualize the uterus and ovaries. Color and duplex Doppler ultrasound was utilized to evaluate blood flow to the ovaries.  COMPARISON:  CT 08/25/2013 .  FINDINGS: Uterus  Measurements: 7.5 x 3.7 x 5.6 cm. No fibroids or other mass visualized.  Endometrium  Thickness: 10.7 mm.  No focal abnormality visualized.  Right ovary  Measurements: 3.5 x 2.2 x 2.5 cm. Normal appearance/no adnexal mass.  Left ovary  Measurements: 3.3 x 1.8 x 2.6 cm. Approximately 1 cm cyst left ovary, most likely corpus luteal cyst.  Pulsed Doppler evaluation of both ovaries demonstrates normal low-resistance arterial and venous waveforms.  Other findings  No free fluid.  IMPRESSION: 1. No acute abnormality. 2. Approximately 1 cm cyst left ovary, most likely corpus luteal cyst.   Electronically Signed   By: Maisie Fus  Register   On: 08/25/2013 20:31     EKG Interpretation None       MDM   Final diagnoses:  Cyst of left ovary  Trichomonal cervicitis  PID (acute pelvic inflammatory disease)    28 year-old female presents with periumbilical and lower quadrant pain for past few days. Associated nausea, vomiting. No diarrhea. No vaginal bleeding, vaginal discharge, no dysuria or frequency. AFVSS here. Lower abdominal pain, RLQ and suprapubic pain. No CVA tenderness. Lungs clear.  Will do pelvic, scan abdomen. Pelvic exam with purulent discharge, diffuse tenderness. Treated for PID. Wet prep shows Trich - treated with 2g flagyl once. CT shows possible 2 cm L adnexal cyst with possible peripheral enhancement. [place] ordered - no TOA, no torsion.  Instructed to f/u with OB.  Dagmar Hait, MD 08/26/13 0005

## 2013-08-25 NOTE — Discharge Instructions (Signed)
Pelvic Inflammatory Disease °Pelvic inflammatory disease (PID) refers to an infection in some or all of the female organs. The infection can be in the uterus, ovaries, fallopian tubes, or the surrounding tissues in the pelvis. PID can cause abdominal or pelvic pain that comes on suddenly (acute pelvic pain). PID is a serious infection because it can lead to lasting (chronic) pelvic pain or the inability to have children (infertile).  °CAUSES  °The infection is often caused by the normal bacteria found in the vaginal tissues. PID may also be caused by an infection that is spread during sexual contact. PID can also occur following:  °· The birth of a baby.   °· A miscarriage.   °· An abortion.   °· Major pelvic surgery.   °· The use of an intrauterine device (IUD).   °· A sexual assault.   °RISK FACTORS °Certain factors can put a person at higher risk for PID, such as: °· Being younger than 25 years. °· Being sexually active at a young age. °· Using nonbarrier contraception. °· Having multiple sexual partners. °· Having sex with someone who has symptoms of a genital infection. °· Using oral contraception. °Other times, certain behaviors can increase the possibility of getting PID, such as: °· Having sex during your period. °· Using a vaginal douche. °· Having an intrauterine device (IUD) in place. °SYMPTOMS  °· Abdominal or pelvic pain.   °· Fever.   °· Chills.   °· Abnormal vaginal discharge. °· Abnormal uterine bleeding.   °· Unusual pain shortly after finishing your period. °DIAGNOSIS  °Your caregiver will choose some of the following methods to make a diagnosis, such as:  °· Performing a physical exam and history. A pelvic exam typically reveals a very tender uterus and surrounding pelvis.   °· Ordering laboratory tests including a pregnancy test, blood tests, and urine test.  °· Ordering cultures of the vagina and cervix to check for a sexually transmitted infection (STI). °· Performing an ultrasound.    °· Performing a laparoscopic procedure to look inside the pelvis.   °TREATMENT  °· Antibiotic medicines may be prescribed and taken by mouth.   °· Sexual partners may be treated when the infection is caused by a sexually transmitted disease (STD).   °· Hospitalization may be needed to give antibiotics intravenously. °· Surgery may be needed, but this is rare. °It may take weeks until you are completely well. If you are diagnosed with PID, you should also be checked for human immunodeficiency virus (HIV).   °HOME CARE INSTRUCTIONS  °· If given, take your antibiotics as directed. Finish the medicine even if you start to feel better.   °· Only take over-the-counter or prescription medicines for pain, discomfort, or fever as directed by your caregiver.   °· Do not have sexual intercourse until treatment is completed or as directed by your caregiver. If PID is confirmed, your recent sexual partner(s) will need treatment.   °· Keep your follow-up appointments. °SEEK MEDICAL CARE IF:  °· You have increased or abnormal vaginal discharge.   °· You need prescription medicine for your pain.   °· You vomit.   °· You cannot take your medicines.   °· Your partner has an STD.   °SEEK IMMEDIATE MEDICAL CARE IF:  °· You have a fever.   °· You have increased abdominal or pelvic pain.   °· You have chills.   °· You have pain when you urinate.   °· You are not better after 72 hours following treatment.   °MAKE SURE YOU:  °· Understand these instructions. °· Will watch your condition. °· Will get help right away if you are not doing well or get worse. °  Document Released: 01/28/2005 Document Revised: 05/25/2012 Document Reviewed: 01/24/2011 °Exit[employer]® Patient Information ©2015 [employer]. This information is not intended to replace advice given to you by your health care provider. Make sure you discuss any questions you have with your health care provider. ° °

## 2013-08-26 LAB — GC/CHLAMYDIA PROBE AMP
CT PROBE, AMP APTIMA: NEGATIVE
GC Probe RNA: NEGATIVE

## 2013-09-12 ENCOUNTER — Encounter (HOSPITAL_BASED_OUTPATIENT_CLINIC_OR_DEPARTMENT_OTHER): Payer: Self-pay | Admitting: Emergency Medicine

## 2013-09-12 ENCOUNTER — Emergency Department (HOSPITAL_BASED_OUTPATIENT_CLINIC_OR_DEPARTMENT_OTHER): Payer: Medicare HMO

## 2013-09-12 ENCOUNTER — Emergency Department (HOSPITAL_BASED_OUTPATIENT_CLINIC_OR_DEPARTMENT_OTHER)
Admission: EM | Admit: 2013-09-12 | Discharge: 2013-09-13 | Disposition: A | Discharge status: Home / Self Care | Payer: Medicare HMO | Attending: Emergency Medicine | Admitting: Emergency Medicine

## 2013-09-12 DIAGNOSIS — R112 Nausea with vomiting, unspecified: Secondary | ICD-10-CM | POA: Insufficient documentation

## 2013-09-12 DIAGNOSIS — Z791 Long term (current) use of non-steroidal anti-inflammatories (NSAID): Secondary | ICD-10-CM | POA: Insufficient documentation

## 2013-09-12 DIAGNOSIS — Z79899 Other long term (current) drug therapy: Secondary | ICD-10-CM | POA: Insufficient documentation

## 2013-09-12 DIAGNOSIS — F17200 Nicotine dependence, unspecified, uncomplicated: Secondary | ICD-10-CM | POA: Insufficient documentation

## 2013-09-12 DIAGNOSIS — N949 Unspecified condition associated with female genital organs and menstrual cycle: Secondary | ICD-10-CM | POA: Diagnosis not present

## 2013-09-12 DIAGNOSIS — Z8619 Personal history of other infectious and parasitic diseases: Secondary | ICD-10-CM | POA: Diagnosis not present

## 2013-09-12 DIAGNOSIS — J45909 Unspecified asthma, uncomplicated: Secondary | ICD-10-CM | POA: Diagnosis not present

## 2013-09-12 DIAGNOSIS — F411 Generalized anxiety disorder: Secondary | ICD-10-CM | POA: Insufficient documentation

## 2013-09-12 DIAGNOSIS — F319 Bipolar disorder, unspecified: Secondary | ICD-10-CM | POA: Insufficient documentation

## 2013-09-12 DIAGNOSIS — Z880 Allergy status to penicillin: Secondary | ICD-10-CM | POA: Insufficient documentation

## 2013-09-12 DIAGNOSIS — Z3202 Encounter for pregnancy test, result negative: Secondary | ICD-10-CM | POA: Insufficient documentation

## 2013-09-12 DIAGNOSIS — R638 Other symptoms and signs concerning food and fluid intake: Secondary | ICD-10-CM | POA: Insufficient documentation

## 2013-09-12 DIAGNOSIS — R6883 Chills (without fever): Secondary | ICD-10-CM | POA: Insufficient documentation

## 2013-09-12 DIAGNOSIS — R102 Pelvic and perineal pain: Secondary | ICD-10-CM

## 2013-09-12 DIAGNOSIS — Z792 Long term (current) use of antibiotics: Secondary | ICD-10-CM | POA: Insufficient documentation

## 2013-09-12 LAB — URINALYSIS, ROUTINE W REFLEX MICROSCOPIC
Bilirubin Urine: NEGATIVE
GLUCOSE, UA: NEGATIVE mg/dL
Hgb urine dipstick: NEGATIVE
Ketones, ur: NEGATIVE mg/dL
Leukocytes, UA: NEGATIVE
NITRITE: NEGATIVE
PH: 6 (ref 5.0–8.0)
Protein, ur: NEGATIVE mg/dL
Specific Gravity, Urine: 1.027 (ref 1.005–1.030)
Urobilinogen, UA: 1 mg/dL (ref 0.0–1.0)

## 2013-09-12 LAB — COMPREHENSIVE METABOLIC PANEL
ALT: 10 U/L (ref 0–35)
AST: 10 U/L (ref 0–37)
Albumin: 3.9 g/dL (ref 3.5–5.2)
Alkaline Phosphatase: 56 U/L (ref 39–117)
Anion gap: 12 (ref 5–15)
BILIRUBIN TOTAL: 0.4 mg/dL (ref 0.3–1.2)
BUN: 10 mg/dL (ref 6–23)
CHLORIDE: 107 meq/L (ref 96–112)
CO2: 26 meq/L (ref 19–32)
Calcium: 10 mg/dL (ref 8.4–10.5)
Creatinine, Ser: 0.6 mg/dL (ref 0.50–1.10)
GFR calc Af Amer: >90 mL/min (ref >90)
GFR calc non Af Amer: >90 mL/min (ref >90)
Glucose, Bld: 110 mg/dL — ABNORMAL HIGH (ref 70–99)
Potassium: 3.5 mEq/L — ABNORMAL LOW (ref 3.7–5.3)
SODIUM: 145 meq/L (ref 137–147)
Total Protein: 7 g/dL (ref 6.0–8.3)

## 2013-09-12 LAB — CBC WITH DIFFERENTIAL/PLATELET
BASOS ABS: 0 10*3/uL (ref 0.0–0.1)
BASOS PCT: 0 % (ref 0–1)
Eosinophils Absolute: 0.1 10*3/uL (ref 0.0–0.7)
Eosinophils Relative: 1 % (ref 0–5)
HEMATOCRIT: 32.4 % — AB (ref 36.0–46.0)
Hemoglobin: 11 g/dL — ABNORMAL LOW (ref 12.0–15.0)
LYMPHS PCT: 30 % (ref 12–46)
Lymphs Abs: 2.4 10*3/uL (ref 0.7–4.0)
MCH: 33 pg (ref 26.0–34.0)
MCHC: 34 g/dL (ref 30.0–36.0)
MCV: 97.3 fL (ref 78.0–100.0)
MONO ABS: 0.5 10*3/uL (ref 0.1–1.0)
Monocytes Relative: 6 % (ref 3–12)
NEUTROS ABS: 5.1 10*3/uL (ref 1.7–7.7)
NEUTROS PCT: 63 % (ref 43–77)
PLATELETS: 233 10*3/uL (ref 150–400)
RBC: 3.33 MIL/uL — ABNORMAL LOW (ref 3.87–5.11)
RDW: 13.5 % (ref 11.5–15.5)
WBC: 8.1 10*3/uL (ref 4.0–10.5)

## 2013-09-12 LAB — LIPASE, BLOOD: LIPASE: 51 U/L (ref 11–59)

## 2013-09-12 LAB — PREGNANCY, URINE: Preg Test, Ur: NEGATIVE

## 2013-09-12 MED ORDER — SODIUM CHLORIDE 0.9 % IV SOLN
1000.0000 mL | Freq: Once | INTRAVENOUS | Status: AC
  Administered 2013-09-13: 1000 mL via INTRAVENOUS

## 2013-09-12 MED ORDER — SODIUM CHLORIDE 0.9 % IV SOLN
1000.0000 mL | INTRAVENOUS | Status: DC
  Administered 2013-09-13: 1000 mL via INTRAVENOUS

## 2013-09-12 MED ORDER — SODIUM CHLORIDE 0.9 % IV SOLN
1000.0000 mL | Freq: Once | INTRAVENOUS | Status: AC
  Administered 2013-09-12: 1000 mL via INTRAVENOUS

## 2013-09-12 MED ORDER — HYDROMORPHONE HCL PF 1 MG/ML IJ SOLN
1.0000 mg | Freq: Once | INTRAMUSCULAR | Status: AC
  Administered 2013-09-12: 1 mg via INTRAVENOUS
  Filled 2013-09-12: qty 1

## 2013-09-12 MED ORDER — IOHEXOL 300 MG/ML  SOLN
50.0000 mL | Freq: Once | INTRAMUSCULAR | Status: AC | PRN
  Administered 2013-09-12: 50 mL via ORAL

## 2013-09-12 MED ORDER — ONDANSETRON HCL 4 MG/2ML IJ SOLN
4.0000 mg | Freq: Once | INTRAMUSCULAR | Status: AC
  Administered 2013-09-12: 4 mg via INTRAVENOUS
  Filled 2013-09-12: qty 2

## 2013-09-12 NOTE — ED Notes (Signed)
Pt reports seen here recently and dx with trich.  States pain and n/v worsening.  Reports has appt with obgyn 5 days.

## 2013-09-12 NOTE — ED Provider Notes (Signed)
CSN: [id number]     Arrival date & time 09/12/13  2142 History  This chart was scribed for Kimberly Booze, MD by Phillis Haggis, ED Scribe. This patient was seen in room MH10/MH10 and patient care was started at 11:00 PM.    Chief Complaint  Patient presents with  . Pelvic Pain   Patient is a 28 y.o. female presenting with pelvic pain. The history is provided by the patient. No language interpreter was used.  Pelvic Pain  HPI Comments: [name] is a 28 y.o. female with a history of trichomonas and ovarian cysts who presents to the Emergency Department complaining of gradually worsening right pelvic pain onset one week ago. She rates the pain as 9/10 and pain is worsened with movement. She reports associated vomiting, intermittent frequency, chills, and a decrease in appetite. She states that she is unable to pick up her child due to pain. She reports that she ate one hotdog today but it immediately came back up. She denies fever, frequency, dysuria, hematuria, vaginal discharge and diaphoresis. She states that she has been taking her medications from when she was first diagnosed with trichomonas but states the symptoms have worsened. She reports that she has been taking ibuprofen for the pain to no relief. She reports that her boyfriend's doctor did not diagnose him with trichomonas. She denies history of appendectomy. She states that she was unable to get an appointment with her OB/Gyn until Friday. She reports her PCP is Dr. Adrian Prince.    Past Medical History  Diagnosis Date  . Bipolar 1 disorder   . Anxiety   . Asthma   . Bipolar 1 disorder   . Anxiety    Past Surgical History  Procedure Laterality Date  . Tonsillectomy    . Cervical biopsy  w/ loop electrode excision    . Leep     No family history on file. History  Substance Use Topics  . Smoking status: Current Every Day Smoker -- 0.50 packs/day  . Smokeless tobacco: Not on file  . Alcohol Use: No   OB History   Grav  Para Term Preterm Abortions TAB SAB Ect Mult Living   1              Review of Systems  Constitutional: Positive for chills and appetite change. Negative for fever and diaphoresis.  Gastrointestinal: Positive for nausea and vomiting.  Genitourinary: Positive for frequency and pelvic pain. Negative for dysuria, hematuria, vaginal discharge and difficulty urinating.  All other systems reviewed and are negative.  Allergies  Amoxicillin-pot clavulanate and Codeine  Home Medications   Prior to Admission medications   Medication Sig Start Date End Date Taking? Authorizing Provider  busPIRone (BUSPAR) 15 MG tablet Take 15 mg by mouth 3 (three) times daily.    Historical Provider, MD  clindamycin (CLEOCIN) 300 MG capsule Take 1 capsule (300 mg total) by mouth 4 (four) times daily. X 7 days 07/02/13   April K Palumbo-Rasch, MD  doxycycline (VIBRA-TABS) 100 MG tablet Take 1 tablet (100 mg total) by mouth 2 (two) times daily. 08/25/13   Dagmar Hait, MD  meloxicam (MOBIC) 7.5 MG tablet Take 1 tablet (7.5 mg total) by mouth daily. 07/02/13   April K Palumbo-Rasch, MD  ondansetron (ZOFRAN ODT) 8 MG disintegrating tablet Take 1 tablet (8 mg total) by mouth every 8 (eight) hours as needed for nausea or vomiting. 02/06/13   Hope Orlene Och, NP  oxyCODONE-acetaminophen (PERCOCET) 5-325 MG per tablet  Take 1 tablet by mouth every 6 (six) hours as needed for moderate pain. 08/25/13   Dagmar HaitWilliam Blair Walden, MD  sertraline (ZOLOFT) 100 MG tablet Take 100 mg by mouth daily.    Historical Provider, MD   BP 151/87  Pulse 111  Temp(Src) 98.6 F (37 C) (Oral)  Resp 18  Ht 5\' 3"  (1.6 m)  Wt 172 lb (78.019 kg)  BMI 30.48 kg/m2  SpO2 100%  LMP 07/11/2013  Physical Exam  Nursing note and vitals reviewed. Constitutional: She is oriented to person, place, and time. She appears well-developed and well-nourished. No distress.  HENT:  Head: Normocephalic and atraumatic.  Mouth/Throat: Oropharynx is clear and  moist.  Eyes: Conjunctivae and EOM are normal. Pupils are equal, round, and reactive to light.  Neck: Normal range of motion. Neck supple. No JVD present.  Cardiovascular: Normal rate, regular rhythm and normal heart sounds.   No murmur heard. Pulmonary/Chest: Effort normal and breath sounds normal. She has no wheezes. She has no rales.  Abdominal: Soft. She exhibits no mass.  Moderate RLQ tenderness with +/- rebounding tenderness. Mild right CVA tenderness. Bowel sounds decreased.   Genitourinary:  Normal external female genitalia. Moderate amount of cloudy fluid present in the vaginal vault. Cervix is closed. There is generalized tenderness but no localized tenderness and no cervical motion tenderness. Fundus is normal size and position. There are no adnexal masses.  Musculoskeletal: Normal range of motion. She exhibits no edema.  Lymphadenopathy:    She has no cervical adenopathy.  Neurological: She is alert and oriented to person, place, and time. She has normal reflexes. No cranial nerve deficit. Coordination normal.  Skin: Skin is warm and dry. No rash noted.  Psychiatric: She has a normal mood and affect. Her behavior is normal. Thought content normal.    ED Course  Procedures (including critical care time) DIAGNOSTIC STUDIES: Oxygen Saturation is 100% on room air, normal by my interpretation.    COORDINATION OF CARE: 11:09 PM-Discussed treatment plan which includes CT scan, labs, fluids, pain and nausea medication with pt at bedside and pt agreed to plan.   Labs Review Labs Reviewed  CBC WITH DIFFERENTIAL - Abnormal; Notable for the following:    RBC 3.33 (*)    Hemoglobin 11.0 (*)    HCT 32.4 (*)    All other components within normal limits  URINALYSIS, ROUTINE W REFLEX MICROSCOPIC - Abnormal; Notable for the following:    APPearance CLOUDY (*)    All other components within normal limits  PREGNANCY, URINE  COMPREHENSIVE METABOLIC PANEL  LIPASE, BLOOD   Imaging  Review Ct Abdomen Pelvis W Contrast  09/13/2013   CLINICAL DATA:  Right lower quadrant pain  EXAM: CT ABDOMEN AND PELVIS WITH CONTRAST  TECHNIQUE: Multidetector CT imaging of the abdomen and pelvis was performed using the standard protocol following bolus administration of intravenous contrast.  CONTRAST:  50mL OMNIPAQUE IOHEXOL 300 MG/ML SOLN, 100mL OMNIPAQUE IOHEXOL 300 MG/ML SOLN  COMPARISON:  Prior ultrasound from 08/25/2013  FINDINGS: The visualized lung bases are clear.  The liver demonstrates a normal contrast enhanced appearance. Gallbladder within normal limits. No biliary ductal dilatation. The spleen, adrenal glands, and pancreas demonstrate a normal contrast enhanced appearance.  Kidneys are equal size with symmetric enhancement. No nephrolithiasis, hydronephrosis, or focal enhancing renal mass.  Stomach within normal limits. No evidence of bowel obstruction. Appendix well visualized in the right lower quadrant and is of normal caliber and appearance without associated inflammatory changes to suggest acute  appendicitis. No abnormal wall thickening, mucosal enhancement, or inflammatory fat stranding seen about the bowels.  Bladder is decompressed but grossly normal. Uterus and ovaries within normal limits for patient age.  No free air or fluid. No pathologically enlarged intra-abdominal pelvic lymph nodes. Normal intravascular enhancement seen throughout the abdomen and pelvis.  No acute osseus abnormality. No worrisome lytic or blastic osseous lesions.  IMPRESSION: 1. No CT evidence of acute intra-abdominal or pelvic process identified. 2. Normal appendix.   Electronically Signed   By: Rise Mu M.D.   On: 09/13/2013 01:11   Images viewed by me.  MDM   Final diagnoses:  Pelvic pain in female    Right lower quadrant pain with degree of localization worrisome for possible appendicitis. She did have a CT of her previous ED visit but I am definitely concerned about possibility of  appendicitis and she will be sent for repeat CT scan.  CT shows no evidence of appendicitis. Previously visualized ovarian cyst has resolved. Her pelvic exam does not indicate pelvic inflammatory disease. She does have diffuse tenderness but no cervical motion tenderness. This point, I am suspicious that her pain might be related to pelvic adhesions. She has an appointment with her gynecologist in the coming week and she is to keep that appointment. In the meantime, she is given a prescription for oxycodone-acetaminophen.  I personally performed the services described in this documentation, which was scribed in my presence. The recorded information has been reviewed and is accurate.     Kimberly Booze, MD 09/13/13 [phone]

## 2013-09-13 DIAGNOSIS — N949 Unspecified condition associated with female genital organs and menstrual cycle: Secondary | ICD-10-CM | POA: Diagnosis not present

## 2013-09-13 LAB — WET PREP, GENITAL
Clue Cells Wet Prep HPF POC: NONE SEEN
Trich, Wet Prep: NONE SEEN
Yeast Wet Prep HPF POC: NONE SEEN

## 2013-09-13 LAB — HIV ANTIBODY (ROUTINE TESTING W REFLEX): HIV 1&2 Ab, 4th Generation: NONREACTIVE

## 2013-09-13 LAB — RPR

## 2013-09-13 MED ORDER — IOHEXOL 300 MG/ML  SOLN
100.0000 mL | Freq: Once | INTRAMUSCULAR | Status: AC | PRN
  Administered 2013-09-13: 100 mL via INTRAVENOUS

## 2013-09-13 MED ORDER — OXYCODONE-ACETAMINOPHEN 5-325 MG PO TABS: 1.0000 | ORAL_TABLET | ORAL | Status: DC | PRN

## 2013-09-13 MED ORDER — HYDROMORPHONE HCL PF 1 MG/ML IJ SOLN
1.0000 mg | Freq: Once | INTRAMUSCULAR | Status: AC
  Administered 2013-09-13: 1 mg via INTRAVENOUS

## 2013-09-13 MED ORDER — HYDROMORPHONE HCL PF 1 MG/ML IJ SOLN
INTRAMUSCULAR | Status: AC
  Filled 2013-09-13: qty 1

## 2013-09-13 NOTE — Discharge Instructions (Signed)
The cause for your pain was not clear today. CT scan and white blood cell count are normal. Please keep the appointment with your gynecologist for further evaluation.  Pelvic Pain Female pelvic pain can be caused by many different things and start from a variety of places. Pelvic pain refers to pain that is located in the lower half of the abdomen and between your hips. The pain may occur over a short period of time (acute) or may be reoccurring (chronic). The cause of pelvic pain may be related to disorders affecting the female reproductive organs (gynecologic), but it may also be related to the bladder, kidney stones, an intestinal complication, or muscle or skeletal problems. Getting help right away for pelvic pain is important, especially if there has been severe, sharp, or a sudden onset of unusual pain. It is also important to get help right away because some types of pelvic pain can be life threatening.  CAUSES  Below are only some of the causes of pelvic pain. The causes of pelvic pain can be in one of several categories.   Gynecologic.  Pelvic inflammatory disease.  Sexually transmitted infection.  Ovarian cyst or a twisted ovarian ligament (ovarian torsion).  Uterine lining that grows outside the uterus (endometriosis).  Fibroids, cysts, or tumors.  Ovulation.  Pregnancy.  Pregnancy that occurs outside the uterus (ectopic pregnancy).  Miscarriage.  Labor.  Abruption of the placenta or ruptured uterus.  Infection.  Uterine infection (endometritis).  Bladder infection.  Diverticulitis.  Miscarriage related to a uterine infection (septic abortion).  Bladder.  Inflammation of the bladder (cystitis).  Kidney stone(s).  Gastrointestinal.  Constipation.  Diverticulitis.  Neurologic.  Trauma.  Feeling pelvic pain because of mental or emotional causes (psychosomatic).  Cancers of the bowel or pelvis. EVALUATION  Your caregiver will want to take a careful  history of your concerns. This includes recent changes in your health, a careful gynecologic history of your periods (menses), and a sexual history. Obtaining your family history and medical history is also important. Your caregiver may suggest a pelvic exam. A pelvic exam will help identify the location and severity of the pain. It also helps in the evaluation of which organ system may be involved. In order to identify the cause of the pelvic pain and be properly treated, your caregiver may order tests. These tests may include:   A pregnancy test.  Pelvic ultrasonography.  An X-ray exam of the abdomen.  A urinalysis or evaluation of vaginal discharge.  Blood tests. HOME CARE INSTRUCTIONS   Only take over-the-counter or prescription medicines for pain, discomfort, or fever as directed by your caregiver.   Rest as directed by your caregiver.   Eat a balanced diet.   Drink enough fluids to make your urine clear or pale yellow, or as directed.   Avoid sexual intercourse if it causes pain.   Apply warm or cold compresses to the lower abdomen depending on which one helps the pain.   Avoid stressful situations.   Keep a journal of your pelvic pain. Write down when it started, where the pain is located, and if there are things that seem to be associated with the pain, such as food or your menstrual cycle.  Follow up with your caregiver as directed.  SEEK MEDICAL CARE IF:  Your medicine does not help your pain.  You have abnormal vaginal discharge. SEEK IMMEDIATE MEDICAL CARE IF:   You have heavy bleeding from the vagina.   Your pelvic pain increases.  You feel light-headed or faint.   You have chills.   You have pain with urination or blood in your urine.   You have uncontrolled diarrhea or vomiting.   You have a fever or persistent symptoms for more than 3 days.  You have a fever and your symptoms suddenly get worse.   You are being physically or  sexually abused.  MAKE SURE YOU:  Understand these instructions.  Will watch your condition.  Will get help if you are not doing well or get worse. Document Released: 12/26/2003 Document Revised: 06/14/2013 Document Reviewed: 05/20/2011 [facility] Patient Information 2015 [place], [place]. This information is not intended to replace advice given to you by your health care provider. Make sure you discuss any questions you have with your health care provider.  Acetaminophen; Oxycodone tablets What is this medicine? ACETAMINOPHEN; OXYCODONE (a set a MEE noe fen; ox i KOE done) is a pain reliever. It is used to treat mild to moderate pain. This medicine may be used for other purposes; ask your health care provider or pharmacist if you have questions. COMMON BRAND NAME(S): Endocet, Magnacet, Narvox, Percocet, Perloxx, Primalev, Primlev, Roxicet, Xolox What should I tell my health care provider before I take this medicine? They need to know if you have any of these conditions: -brain tumor -Crohn's disease, inflammatory bowel disease, or ulcerative colitis -drug abuse or addiction -head injury -heart or circulation problems -if you often drink alcohol -kidney disease or problems going to the bathroom -liver disease -lung disease, asthma, or breathing problems -an unusual or allergic reaction to acetaminophen, oxycodone, other opioid analgesics, other medicines, foods, dyes, or preservatives -pregnant or trying to get pregnant -breast-feeding How should I use this medicine? Take this medicine by mouth with a full glass of water. Follow the directions on the prescription label. Take your medicine at regular intervals. Do not take your medicine more often than directed. Talk to your pediatrician regarding the use of this medicine in children. Special care may be needed. Patients over 45 years old may have a stronger reaction and need a smaller dose. Overdosage: If you think you have taken too  much of this medicine contact a poison control center or emergency room at once. NOTE: This medicine is only for you. Do not share this medicine with others. What if I miss a dose? If you miss a dose, take it as soon as you can. If it is almost time for your next dose, take only that dose. Do not take double or extra doses. What may interact with this medicine? -alcohol -antihistamines -barbiturates like amobarbital, butalbital, butabarbital, methohexital, pentobarbital, phenobarbital, thiopental, and secobarbital -benztropine -drugs for bladder problems like solifenacin, trospium, oxybutynin, tolterodine, hyoscyamine, and methscopolamine -drugs for breathing problems like ipratropium and tiotropium -drugs for certain stomach or intestine problems like propantheline, homatropine methylbromide, glycopyrrolate, atropine, belladonna, and dicyclomine -general anesthetics like etomidate, ketamine, nitrous oxide, propofol, desflurane, enflurane, halothane, isoflurane, and sevoflurane -medicines for depression, anxiety, or psychotic disturbances -medicines for sleep -muscle relaxants -naltrexone -narcotic medicines (opiates) for pain -phenothiazines like perphenazine, thioridazine, chlorpromazine, mesoridazine, fluphenazine, prochlorperazine, promazine, and trifluoperazine -scopolamine -tramadol -trihexyphenidyl This list may not describe all possible interactions. Give your health care provider a list of all the medicines, herbs, non-prescription drugs, or dietary supplements you use. Also tell them if you smoke, drink alcohol, or use illegal drugs. Some items may interact with your medicine. What should I watch for while using this medicine? Tell your doctor or health care professional if your  pain does not go away, if it gets worse, or if you have new or a different type of pain. You may develop tolerance to the medicine. Tolerance means that you will need a higher dose of the medication for pain  relief. Tolerance is normal and is expected if you take this medicine for a long time. Do not suddenly stop taking your medicine because you may develop a severe reaction. Your body becomes used to the medicine. This does NOT mean you are addicted. Addiction is a behavior related to getting and using a drug for a non-medical reason. If you have pain, you have a medical reason to take pain medicine. Your doctor will tell you how much medicine to take. If your doctor wants you to stop the medicine, the dose will be slowly lowered over time to avoid any side effects. You may get drowsy or dizzy. Do not drive, use machinery, or do anything that needs mental alertness until you know how this medicine affects you. Do not stand or sit up quickly, especially if you are an older patient. This reduces the risk of dizzy or fainting spells. Alcohol may interfere with the effect of this medicine. Avoid alcoholic drinks. There are different types of narcotic medicines (opiates) for pain. If you take more than one type at the same time, you may have more side effects. Give your health care provider a list of all medicines you use. Your doctor will tell you how much medicine to take. Do not take more medicine than directed. Call emergency for help if you have problems breathing. The medicine will cause constipation. Try to have a bowel movement at least every 2 to 3 days. If you do not have a bowel movement for 3 days, call your doctor or health care professional. Do not take Tylenol (acetaminophen) or medicines that have acetaminophen with this medicine. Too much acetaminophen can be very dangerous. Many nonprescription medicines contain acetaminophen. Always read the labels carefully to avoid taking more acetaminophen. What side effects may I notice from receiving this medicine? Side effects that you should report to your doctor or health care professional as soon as possible: -allergic reactions like skin rash, itching  or hives, swelling of the face, lips, or tongue -breathing difficulties, wheezing -confusion -light headedness or fainting spells -severe stomach pain -unusually weak or tired -yellowing of the skin or the whites of the eyes Side effects that usually do not require medical attention (report to your doctor or health care professional if they continue or are bothersome): -dizziness -drowsiness -nausea -vomiting This list may not describe all possible side effects. Call your doctor for medical advice about side effects. You may report side effects to FDA at 1-800-FDA-1088. Where should I keep my medicine? Keep out of the reach of children. This medicine can be abused. Keep your medicine in a safe place to protect it from theft. Do not share this medicine with anyone. Selling or giving away this medicine is dangerous and against the law. Store at room temperature between 20 and 25 degrees C (68 and 77 degrees F). Keep container tightly closed. Protect from light. This medicine may cause accidental overdose and death if it is taken by other adults, children, or pets. Flush any unused medicine down the toilet to reduce the chance of harm. Do not use the medicine after the expiration date. NOTE: This sheet is a summary. It may not cover all possible information. If you have questions about this medicine, talk to your  doctor, pharmacist, or health care provider.  2015, [employer]. (2012-09-21 13:17:35)

## 2013-09-14 LAB — GC/CHLAMYDIA PROBE AMP
CT PROBE, AMP APTIMA: NEGATIVE
GC PROBE AMP APTIMA: NEGATIVE

## 2013-12-13 ENCOUNTER — Encounter (HOSPITAL_BASED_OUTPATIENT_CLINIC_OR_DEPARTMENT_OTHER): Payer: Self-pay | Admitting: Emergency Medicine

## 2014-12-01 IMAGING — CT CT ABD-PELV W/ CM
2 of 4 series · 16 of 46 positions shown, 18 images · IV contrast (omnipaque)
Comparison: Ultrasound 04/01/2013.

CLINICAL DATA: Right lower quadrant pain. History of stone disease.

EXAM:
CT ABDOMEN AND PELVIS WITH CONTRAST
TECHNIQUE: Multidetector CT imaging of the abdomen and pelvis was performed
using the standard protocol following bolus administration of
intravenous contrast.
CONTRAST:  50mL OMNIPAQUE IOHEXOL 300 MG/ML SOLN, 100mL OMNIPAQUE
IOHEXOL 300 MG/ML SOLN

[Series 2: abd/pelvis 5.0 b31f · axial · 0.81mm/px · z∈[-454,-39]mm · 13 of 91 slices shown, 15 images]
[im 4/91  soft-tissue]
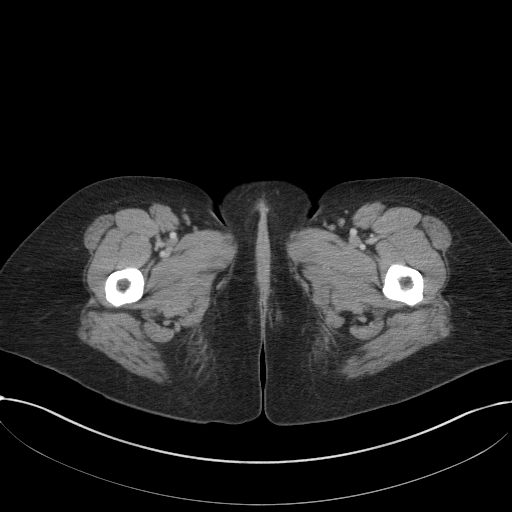
[im 4/91  bone]
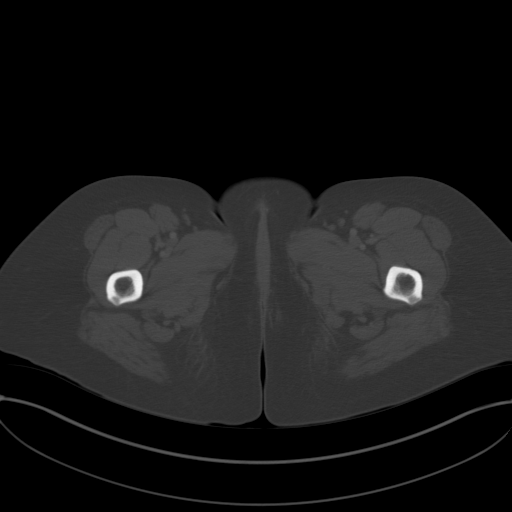
[im 12/91  soft-tissue]
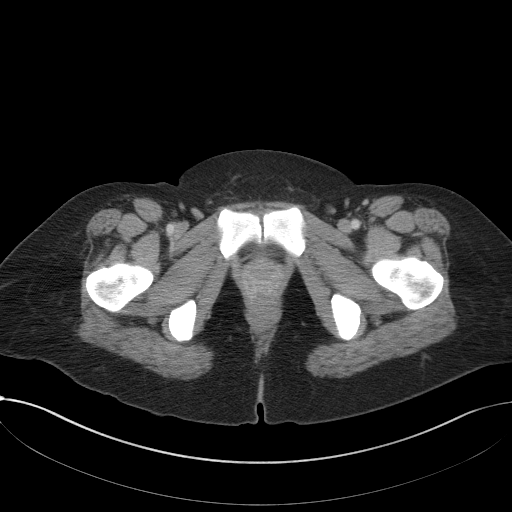
[im 20/91  soft-tissue]
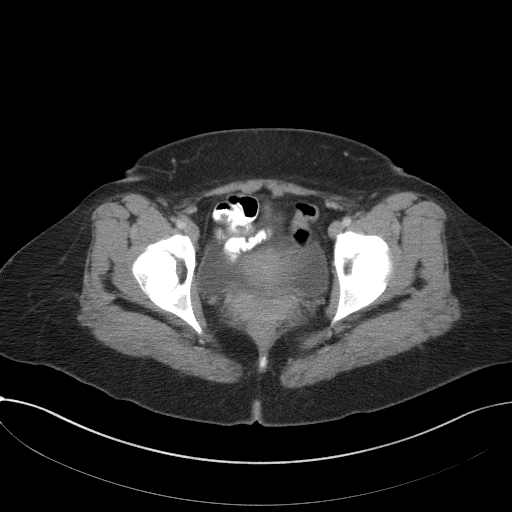
[im 24/91  soft-tissue]
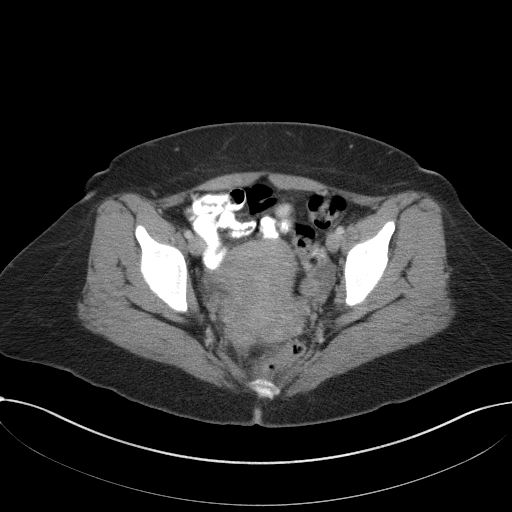
[im 32/91  soft-tissue]
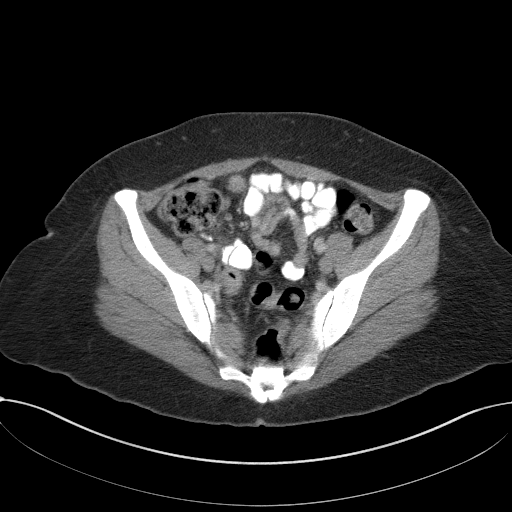
[im 40/91  soft-tissue]
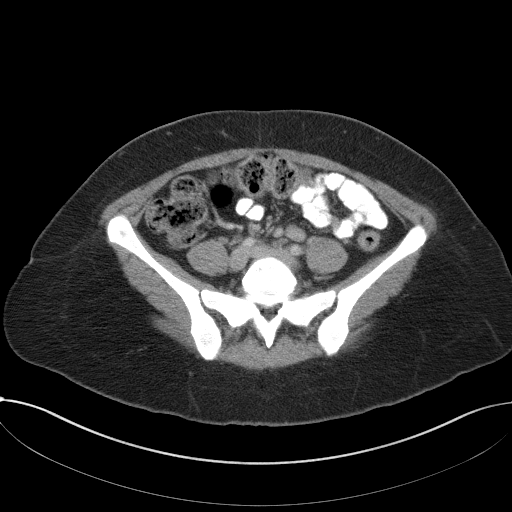
[im 47/91  soft-tissue]
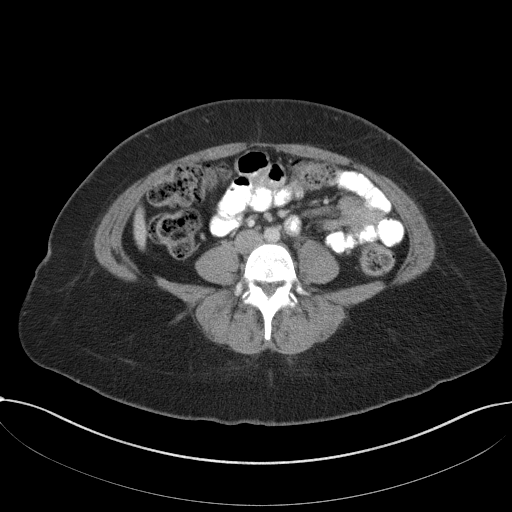
[im 51/91  soft-tissue]
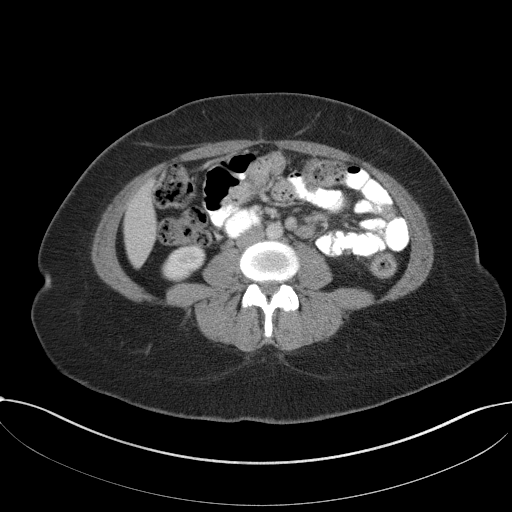
[im 59/91  soft-tissue]
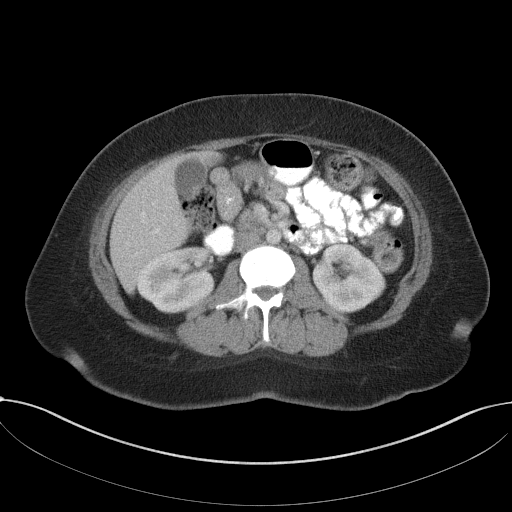
[im 59/91  bone]
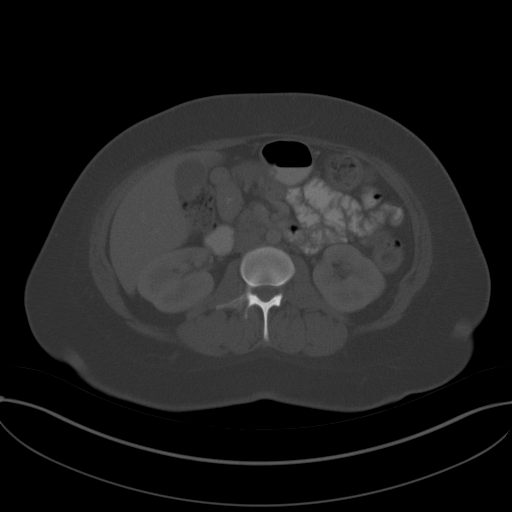
[im 67/91  soft-tissue]
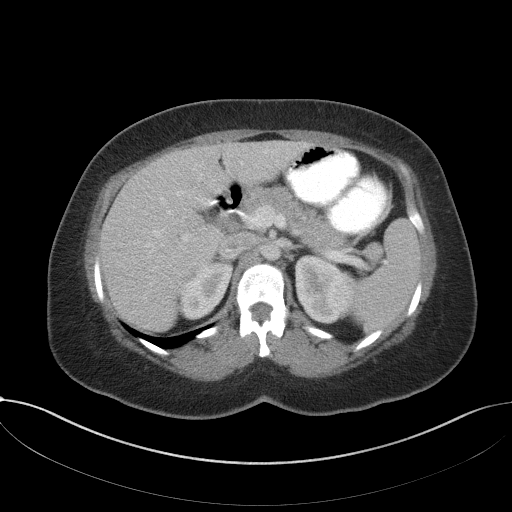
[im 71/91  soft-tissue]
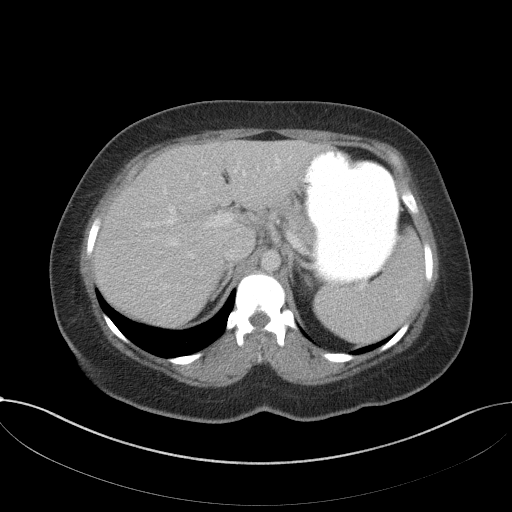
[im 79/91  soft-tissue]
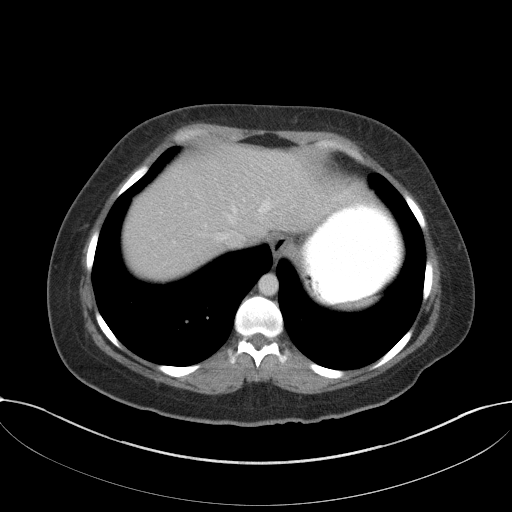
[im 87/91  soft-tissue]
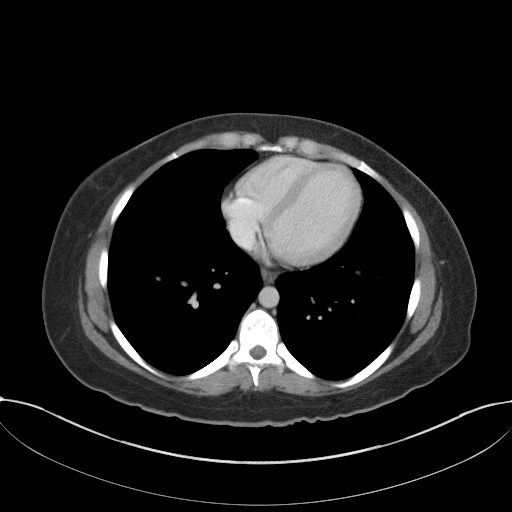

[Series 5: abd/pelvis 3.0 coronal · coronal · 0.79mm/px · 3 of 82 slices shown]
[im 28/82  soft-tissue]
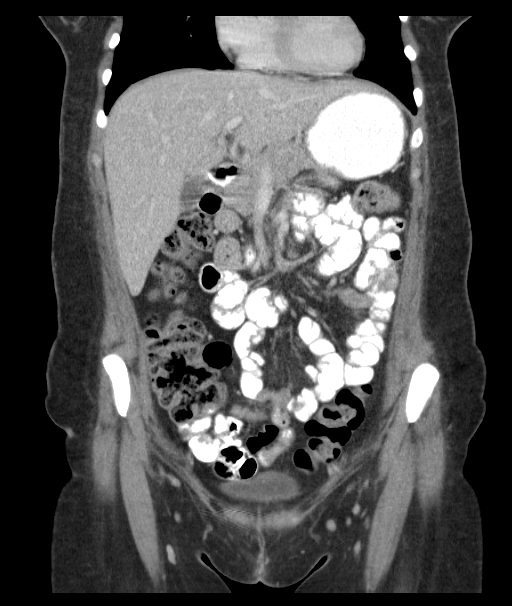
[im 37/82  soft-tissue]
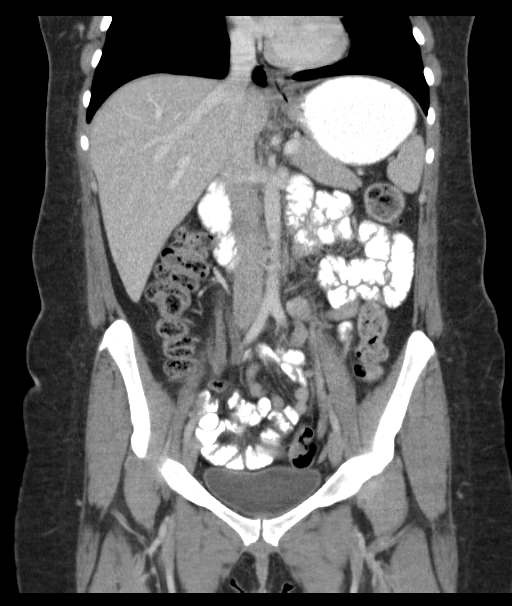
[im 46/82  soft-tissue]
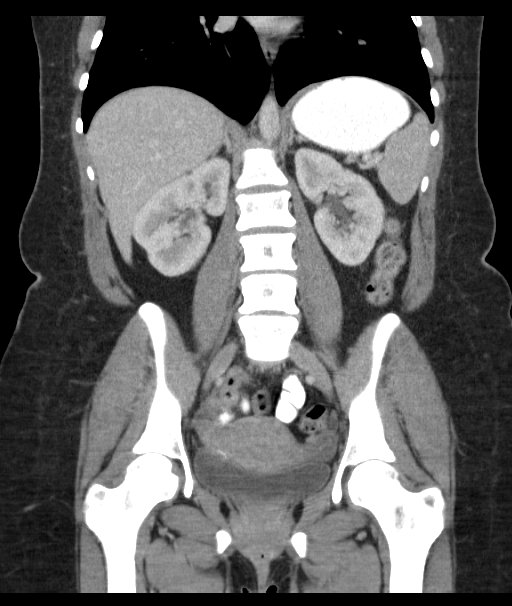

[16 of 46 positions shown; findings below may reference images not displayed]

FINDINGS: Liver normal. Spleen normal. Pancreas normal. No biliary distention.

Adrenals and kidneys are unremarkable. No hydronephrosis with right
ureteral stone. The bladder is nondistended. Uterus is unremarkable.
Tiny left adnexal cyst cannot be excluded. This cyst measures
approximate 2 cm and has questionable enhancement, ultrasound can be
obtained to further evaluate. No free pelvic fluid.

No significant adenopathy. Abdominal aorta normal in caliber.
Visceral branches of the abdominal aorta unremarkable. Portal vein
is patent.

No bowel obstruction. Stool noted throughout the colon. No gastric
distention. Esophagogastric region is normal. Duodenum region is
normal. No mesenteric mass. Tiny umbilical hernia with herniation
fat only. No significant abdominal wall hernia.

Lung bases are clear. Heart size is unremarkable. No acute bony
abnormality.
IMPRESSION: 1. 2 cm left adnexal cyst with questionable peripheral enhancement.
Ultrasound be obtained further evaluate.

2.  No abnormality otherwise noted.

## 2014-12-07 ENCOUNTER — Encounter (HOSPITAL_BASED_OUTPATIENT_CLINIC_OR_DEPARTMENT_OTHER): Payer: Self-pay

## 2014-12-07 ENCOUNTER — Emergency Department (HOSPITAL_BASED_OUTPATIENT_CLINIC_OR_DEPARTMENT_OTHER)
Admission: EM | Admit: 2014-12-07 | Discharge: 2014-12-08 | Disposition: A | Discharge status: Home / Self Care | Payer: Medicare HMO | Attending: Emergency Medicine | Admitting: Emergency Medicine

## 2014-12-07 DIAGNOSIS — Z720 Tobacco use: Secondary | ICD-10-CM | POA: Diagnosis not present

## 2014-12-07 DIAGNOSIS — A5901 Trichomonal vulvovaginitis: Secondary | ICD-10-CM | POA: Diagnosis not present

## 2014-12-07 DIAGNOSIS — Z79899 Other long term (current) drug therapy: Secondary | ICD-10-CM | POA: Diagnosis not present

## 2014-12-07 DIAGNOSIS — N739 Female pelvic inflammatory disease, unspecified: Secondary | ICD-10-CM | POA: Insufficient documentation

## 2014-12-07 DIAGNOSIS — A599 Trichomoniasis, unspecified: Secondary | ICD-10-CM

## 2014-12-07 DIAGNOSIS — F419 Anxiety disorder, unspecified: Secondary | ICD-10-CM | POA: Diagnosis not present

## 2014-12-07 DIAGNOSIS — Z791 Long term (current) use of non-steroidal anti-inflammatories (NSAID): Secondary | ICD-10-CM | POA: Diagnosis not present

## 2014-12-07 DIAGNOSIS — Z792 Long term (current) use of antibiotics: Secondary | ICD-10-CM | POA: Diagnosis not present

## 2014-12-07 DIAGNOSIS — Z880 Allergy status to penicillin: Secondary | ICD-10-CM | POA: Insufficient documentation

## 2014-12-07 DIAGNOSIS — J45909 Unspecified asthma, uncomplicated: Secondary | ICD-10-CM | POA: Diagnosis not present

## 2014-12-07 DIAGNOSIS — Z3202 Encounter for pregnancy test, result negative: Secondary | ICD-10-CM | POA: Diagnosis not present

## 2014-12-07 DIAGNOSIS — N730 Acute parametritis and pelvic cellulitis: Secondary | ICD-10-CM

## 2014-12-07 DIAGNOSIS — F319 Bipolar disorder, unspecified: Secondary | ICD-10-CM | POA: Insufficient documentation

## 2014-12-07 DIAGNOSIS — N898 Other specified noninflammatory disorders of vagina: Secondary | ICD-10-CM | POA: Diagnosis present

## 2014-12-07 LAB — URINALYSIS, ROUTINE W REFLEX MICROSCOPIC
Bilirubin Urine: NEGATIVE
Glucose, UA: NEGATIVE mg/dL
Hgb urine dipstick: NEGATIVE
KETONES UR: NEGATIVE mg/dL
NITRITE: NEGATIVE
PROTEIN: NEGATIVE mg/dL
Specific Gravity, Urine: 1.02 (ref 1.005–1.030)
Urobilinogen, UA: 1 mg/dL (ref 0.0–1.0)
pH: 7 (ref 5.0–8.0)

## 2014-12-07 LAB — CBC WITH DIFFERENTIAL/PLATELET
BASOS ABS: 0 10*3/uL (ref 0.0–0.1)
Basophils Relative: 0 %
EOS ABS: 0.1 10*3/uL (ref 0.0–0.7)
EOS PCT: 1 %
HCT: 30.6 % — ABNORMAL LOW (ref 36.0–46.0)
HEMOGLOBIN: 9.9 g/dL — AB (ref 12.0–15.0)
LYMPHS PCT: 29 %
Lymphs Abs: 2.4 10*3/uL (ref 0.7–4.0)
MCH: 29.6 pg (ref 26.0–34.0)
MCHC: 32.4 g/dL (ref 30.0–36.0)
MCV: 91.3 fL (ref 78.0–100.0)
Monocytes Absolute: 0.6 10*3/uL (ref 0.1–1.0)
Monocytes Relative: 7 %
NEUTROS PCT: 63 %
Neutro Abs: 5 10*3/uL (ref 1.7–7.7)
PLATELETS: 250 10*3/uL (ref 150–400)
RBC: 3.35 MIL/uL — AB (ref 3.87–5.11)
RDW: 13.6 % (ref 11.5–15.5)
WBC: 8.1 10*3/uL (ref 4.0–10.5)

## 2014-12-07 LAB — COMPREHENSIVE METABOLIC PANEL
ALBUMIN: 4.1 g/dL (ref 3.5–5.0)
ALT: 7 U/L — ABNORMAL LOW (ref 14–54)
AST: 12 U/L — AB (ref 15–41)
Alkaline Phosphatase: 48 U/L (ref 38–126)
Anion gap: 3 — ABNORMAL LOW (ref 5–15)
BILIRUBIN TOTAL: 0.8 mg/dL (ref 0.3–1.2)
BUN: 9 mg/dL (ref 6–20)
CO2: 24 mmol/L (ref 22–32)
Calcium: 9 mg/dL (ref 8.9–10.3)
Chloride: 111 mmol/L (ref 101–111)
Creatinine, Ser: 0.51 mg/dL (ref 0.44–1.00)
Glucose, Bld: 86 mg/dL (ref 65–99)
POTASSIUM: 3.7 mmol/L (ref 3.5–5.1)
SODIUM: 138 mmol/L (ref 135–145)
TOTAL PROTEIN: 6.9 g/dL (ref 6.5–8.1)

## 2014-12-07 LAB — URINE MICROSCOPIC-ADD ON

## 2014-12-07 LAB — WET PREP, GENITAL: YEAST WET PREP: NONE SEEN

## 2014-12-07 LAB — PREGNANCY, URINE: Preg Test, Ur: NEGATIVE

## 2014-12-07 MED ORDER — CEFTRIAXONE SODIUM 250 MG IJ SOLR
250.0000 mg | Freq: Once | INTRAMUSCULAR | Status: AC
  Administered 2014-12-07: 250 mg via INTRAMUSCULAR
  Filled 2014-12-07: qty 250

## 2014-12-07 MED ORDER — ONDANSETRON HCL 4 MG/2ML IJ SOLN
4.0000 mg | Freq: Once | INTRAMUSCULAR | Status: AC
  Administered 2014-12-07: 4 mg via INTRAVENOUS
  Filled 2014-12-07: qty 2

## 2014-12-07 MED ORDER — METRONIDAZOLE 500 MG PO TABS
500.0000 mg | ORAL_TABLET | Freq: Once | ORAL | Status: AC
  Administered 2014-12-07: 500 mg via ORAL
  Filled 2014-12-07: qty 1

## 2014-12-07 MED ORDER — KETOROLAC TROMETHAMINE 30 MG/ML IJ SOLN
30.0000 mg | Freq: Once | INTRAMUSCULAR | Status: AC
  Administered 2014-12-07: 30 mg via INTRAVENOUS
  Filled 2014-12-07: qty 1

## 2014-12-07 MED ORDER — SODIUM CHLORIDE 0.9 % IV BOLUS (SEPSIS)
1000.0000 mL | Freq: Once | INTRAVENOUS | Status: AC
  Administered 2014-12-07: 1000 mL via INTRAVENOUS

## 2014-12-07 MED ORDER — DOXYCYCLINE HYCLATE 100 MG PO TABS
100.0000 mg | ORAL_TABLET | Freq: Once | ORAL | Status: AC
  Administered 2014-12-07: 100 mg via ORAL
  Filled 2014-12-07: qty 1

## 2014-12-07 NOTE — ED Provider Notes (Signed)
CSN: [id number]     Arrival date & time 12/07/14  2101 History  By signing my name below, I, Terrance Branch, attest that this documentation has been prepared under the direction and in the presence of Arby Barrette, MD. Electronically Signed: Evon Slack, ED Scribe. 12/08/2014. 12:08 AM.     Chief Complaint  Patient presents with  . Abdominal Cramping  . Vaginal Discharge    Patient is a 29 y.o. female presenting with cramps and vaginal discharge. The history is provided by the patient. No language interpreter was used.  Abdominal Cramping Associated symptoms include abdominal pain.  Vaginal Discharge Associated symptoms: abdominal pain, nausea and vomiting   Associated symptoms: no dysuria    HPI Comments: NAUREEN BENTON is a 29 y.o. female who presents to the Emergency Department complaining of lower abdominal pain onset several weeks. She states that the pain has recently worsened over the past few days. Pt states she is having brownish- yellow vaginal discharge, nausea and vomiting. Pt states that she is 6 weeks post partum. She states that she that the bleeding after the pregnancy has recently stopped 2 weeks prior. She denies any sexual activity since giving birth. Denies dysuria, back pain or other related symptoms.   Past Medical History  Diagnosis Date  . Bipolar 1 disorder (HCC)   . Anxiety   . Asthma   . Bipolar 1 disorder (HCC)   . Anxiety    Past Surgical History  Procedure Laterality Date  . Tonsillectomy    . Cervical biopsy  w/ loop electrode excision    . Leep     No family history on file. Social History  Substance Use Topics  . Smoking status: Current Every Day Smoker -- 0.50 packs/day  . Smokeless tobacco: None  . Alcohol Use: No   OB History    Gravida Para Term Preterm AB TAB SAB Ectopic Multiple Living   1              Review of Systems  Gastrointestinal: Positive for nausea, vomiting and abdominal pain.  Genitourinary: Positive for  vaginal discharge. Negative for dysuria.  Musculoskeletal: Negative for back pain.  All other systems reviewed and are negative.     Allergies  Amoxicillin-pot clavulanate and Codeine  Home Medications   Prior to Admission medications   Medication Sig Start Date End Date Taking? Authorizing Provider  busPIRone (BUSPAR) 15 MG tablet Take 15 mg by mouth 3 (three) times daily.    Historical Provider, MD  cephALEXin (KEFLEX) 500 MG capsule Take 2 capsules (1,000 mg total) by mouth 2 (two) times daily. 12/08/14   Arby Barrette, MD  clindamycin (CLEOCIN) 300 MG capsule Take 1 capsule (300 mg total) by mouth 4 (four) times daily. X 7 days 07/02/13   April Palumbo, MD  doxycycline (VIBRA-TABS) 100 MG tablet Take 1 tablet (100 mg total) by mouth 2 (two) times daily. 08/25/13   Elwin Mocha, MD  doxycycline (VIBRAMYCIN) 100 MG capsule Take 1 capsule (100 mg total) by mouth 2 (two) times daily. One po bid x 7 days 12/08/14   Arby Barrette, MD  meloxicam (MOBIC) 7.5 MG tablet Take 1 tablet (7.5 mg total) by mouth daily. 07/02/13   April Palumbo, MD  metroNIDAZOLE (FLAGYL) 500 MG tablet Take 1 tablet (500 mg total) by mouth 2 (two) times daily. One po bid x 7 days 12/08/14   Arby Barrette, MD  ondansetron (ZOFRAN ODT) 4 MG disintegrating tablet Take 1 tablet (4 mg total)  by mouth every 4 (four) hours as needed for nausea or vomiting. 12/08/14   Arby BarretteMarcy Leiann Sporer, MD  ondansetron (ZOFRAN ODT) 8 MG disintegrating tablet Take 1 tablet (8 mg total) by mouth every 8 (eight) hours as needed for nausea or vomiting. 02/06/13   Hope Orlene OchM Neese, NP  oxyCODONE-acetaminophen (PERCOCET) 5-325 MG per tablet Take 1 tablet by mouth every 6 (six) hours as needed for moderate pain. 08/25/13   Elwin MochaBlair Walden, MD  oxyCODONE-acetaminophen (PERCOCET) 5-325 MG per tablet Take 1 tablet by mouth every 4 (four) hours as needed for moderate pain. 09/13/13   Dione Boozeavid Glick, MD  sertraline (ZOLOFT) 100 MG tablet Take 100 mg by mouth daily.     Historical Provider, MD   BP 115/79 mmHg  Pulse 70  Temp(Src) 97.8 F (36.6 C) (Oral)  Resp 18  Ht 5\' 3"  (1.6 m)  Wt 147 lb (66.679 kg)  BMI 26.05 kg/m2  SpO2 99%  Breastfeeding? No   Physical Exam  Constitutional: She is oriented to person, place, and time. She appears well-developed and well-nourished. No distress.  HENT:  Head: Normocephalic and atraumatic.  Eyes: Conjunctivae and EOM are normal.  Neck: Neck supple. No tracheal deviation present.  Cardiovascular: Normal rate.   Pulmonary/Chest: Effort normal. No respiratory distress.  Abdominal: Soft. There is tenderness.  Genitourinary:  Normal external female genitalia. Speculum examination large amount of whitish yellow discharge in the vaginal vault. Some cervical friability with swab. Bimanual examination some diffuse mild tenderness to palpation. No active bleeding or clot in the vault.  Musculoskeletal: Normal range of motion.  Neurological: She is alert and oriented to person, place, and time.  Skin: Skin is warm and dry.  Psychiatric: She has a normal mood and affect. Her behavior is normal.  Nursing note and vitals reviewed.   ED Course  Procedures (including critical care time) DIAGNOSTIC STUDIES: Oxygen Saturation is 100% on RA, normal by my interpretation.    COORDINATION OF CARE: 10:37 PM-Discussed treatment plan with pt at bedside and pt agreed to plan.     Labs Review Labs Reviewed  WET PREP, GENITAL - Abnormal; Notable for the following:    Trich, Wet Prep TOO NUMEROUS TO COUNT (*)    Clue Cells Wet Prep HPF POC MANY (*)    WBC, Wet Prep HPF POC TOO NUMEROUS TO COUNT (*)    All other components within normal limits  URINALYSIS, ROUTINE W REFLEX MICROSCOPIC (NOT AT [facility]) - Abnormal; Notable for the following:    APPearance CLOUDY (*)    Leukocytes, UA LARGE (*)    All other components within normal limits  URINE MICROSCOPIC-ADD ON - Abnormal; Notable for the following:    Squamous Epithelial /  LPF FEW (*)    Bacteria, UA FEW (*)    All other components within normal limits  COMPREHENSIVE METABOLIC PANEL - Abnormal; Notable for the following:    AST 12 (*)    ALT 7 (*)    Anion gap 3 (*)    All other components within normal limits  CBC WITH DIFFERENTIAL/PLATELET - Abnormal; Notable for the following:    RBC 3.35 (*)    Hemoglobin 9.9 (*)    HCT 30.6 (*)    All other components within normal limits  PREGNANCY, URINE  GC/CHLAMYDIA PROBE AMP ([place]) NOT AT [facility]    Imaging Review No results found.    EKG Interpretation None      MDM   Final diagnoses:  PID (acute pelvic  inflammatory disease)  Trichomonas vaginalis infection   The patient presented with a lot of copious foul-smelling vaginal discharge and pelvic cramping. She is postpartum by 6 weeks. She reports that she has not been sexually active since delivery. After identifying Trichomonas, the patient reports sexual activity about 5 days before delivery. At this time she will be treated for PID with Rocephin, doxycycline and Flagyl. Patient is advised to follow-up with her gynecologist as soon as possible for recheck. She is nontoxic and alert. Vital signs are stable. She does not have leukocytosis. She has not had fever. This time feel she is safe for continued outpatient management.     Arby Barrette, MD 12/08/14 [id number]

## 2014-12-07 NOTE — ED Notes (Signed)
Pt c/o lower abdominal cramping with yellow vaginal discharge since delivering her baby back in September, also had n/v that started yesterday.  Pt states she has an appointment with GYN on Friday.

## 2014-12-08 MED ORDER — ONDANSETRON 4 MG PO TBDP: 4.0000 mg | ORAL_TABLET | ORAL | Status: AC | PRN

## 2014-12-08 MED ORDER — METRONIDAZOLE 500 MG PO TABS: 500.0000 mg | ORAL_TABLET | Freq: Two times a day (BID) | ORAL | Status: AC

## 2014-12-08 MED ORDER — DOXYCYCLINE HYCLATE 100 MG PO CAPS: 100.0000 mg | ORAL_CAPSULE | Freq: Two times a day (BID) | ORAL | Status: DC

## 2014-12-08 MED ORDER — CEPHALEXIN 500 MG PO CAPS: 1000.0000 mg | ORAL_CAPSULE | Freq: Two times a day (BID) | ORAL | Status: AC

## 2014-12-08 NOTE — Discharge Instructions (Signed)
Pelvic Inflammatory Disease °Pelvic inflammatory disease (PID) refers to an infection in some or all of the female organs. The infection can be in the uterus, ovaries, fallopian tubes, or the surrounding tissues in the pelvis. PID can cause abdominal or pelvic pain that comes on suddenly (acute pelvic pain). PID is a serious infection because it can lead to lasting (chronic) pelvic pain or the inability to have children (infertility). °CAUSES °This condition is most often caused by an infection that is spread during sexual contact. However, the infection can also be caused by the normal bacteria that are found in the vaginal tissues if these bacteria travel upward into the reproductive organs. PID can also occur following: °· The birth of a baby. °· A miscarriage. °· An abortion. °· Major pelvic surgery. °· The use of an intrauterine device (IUD). °· A sexual assault. °RISK FACTORS °This condition is more likely to develop in women who: °· Are younger than 27 years of age. °· Are sexually active at a young age. °· Use nonbarrier contraception. °· Have multiple sexual partners. °· Have sex with someone who has symptoms of an STD (sexually transmitted disease). °· Use oral contraception. °At times, certain behaviors can also increase the possibility of getting PID, such as: °· Using a vaginal douche. °· Having an IUD in place. °SYMPTOMS °Symptoms of this condition include: °· Abdominal or pelvic pain. °· Fever. °· Chills. °· Abnormal vaginal discharge. °· Abnormal uterine bleeding. °· Unusual pain shortly after the end of a menstrual period. °· Painful urination. °· Pain with sexual intercourse. °· Nausea and vomiting. °DIAGNOSIS °To diagnose this condition, your health care provider will do a physical exam and take your medical history. A pelvic exam typically reveals great tenderness in the uterus and the surrounding pelvic tissues. You may also have tests, such as: °· Lab tests, including a pregnancy test, blood  tests, and urine test. °· Culture tests of the vagina and cervix to check for an STD. °· Ultrasound. °· A laparoscopic procedure to look inside the pelvis. °· Examining vaginal secretions under a microscope. °TREATMENT °Treatment for this condition may involve one or more approaches. °· Antibiotic medicines may be prescribed to be taken by mouth. °· Sexual partners may need to be treated if the infection is caused by an STD. °· For more severe cases, hospitalization may be needed to give antibiotics directly into a vein through an IV tube. °· Surgery may be needed if other treatments do not help, but this is rare. °It may take weeks until you are completely well. If you are diagnosed with PID, you should also be checked for human immunodeficiency virus (HIV). Your health care provider may test you for infection again 3 months after treatment. You should not have unprotected sex. °HOME CARE INSTRUCTIONS °· Take over-the-counter and prescription medicines only as told by your health care provider. °· If you were prescribed an antibiotic medicine, take it as told by your health care provider. Do not stop taking the antibiotic even if you start to feel better. °· Do not have sexual intercourse until treatment is completed or as told by your health care provider. If PID is confirmed, your recent sexual partners will need treatment, especially if you had unprotected sex. °· Keep all follow-up visits as told by your health care provider. This is important. °SEEK MEDICAL CARE IF: °· You have increased or abnormal vaginal discharge. °· Your pain does not improve. °· You vomit. °· You have a fever. °· You   cannot tolerate your medicines. °· Your partner has an STD. °· You have pain when you urinate. °SEEK IMMEDIATE MEDICAL CARE IF: °· You have increased abdominal or pelvic pain. °· You have chills. °· Your symptoms are not better in 72 hours even with treatment. °  °This information is not intended to replace advice given to  you by your health care provider. Make sure you discuss any questions you have with your health care provider. °  °Document Released: 01/28/2005 Document Revised: 10/19/2014 Document Reviewed: 03/07/2014 °Elsevier Interactive Patient Education ©2016 [employer] °Trichomoniasis °Trichomoniasis is an infection caused by an organism called Trichomonas. The infection can affect both women and men. In women, the outer female genitalia and the vagina are affected. In men, the penis is mainly affected, but the prostate and other reproductive organs can also be involved. Trichomoniasis is a sexually transmitted infection (STI) and is most often passed to another person through sexual contact.  °RISK FACTORS °· Having unprotected sexual intercourse. °· Having sexual intercourse with an infected partner. °SIGNS AND SYMPTOMS  °Symptoms of trichomoniasis in women include: °· Abnormal gray-green frothy vaginal discharge. °· Itching and irritation of the vagina. °· Itching and irritation of the area outside the vagina. °Symptoms of trichomoniasis in men include:  °· Penile discharge with or without pain. °· Pain during urination. This results from inflammation of the urethra. °DIAGNOSIS  °Trichomoniasis may be found during a Pap test or physical exam. Your health care provider may use one of the following methods to help diagnose this infection: °· Testing the pH of the vagina with a test tape. °· Using a vaginal swab test that checks for the Trichomonas organism. A test is available that provides results within a few minutes. °· Examining a urine sample. °· Testing vaginal secretions. °Your health care provider may test you for other STIs, including HIV. °TREATMENT  °· You may be given medicine to fight the infection. Women should inform their health care provider if they could be or are pregnant. Some medicines used to treat the infection should not be taken during pregnancy. °· Your health care provider may recommend  over-the-counter medicines or creams to decrease itching or irritation. °· Your sexual partner will need to be treated if infected. °· Your health care provider may test you for infection again 3 months after treatment. °HOME CARE INSTRUCTIONS  °· Take medicines only as directed by your health care provider. °· Take over-the-counter medicine for itching or irritation as directed by your health care provider. °· Do not have sexual intercourse while you have the infection. °· Women should not douche or wear tampons while they have the infection. °· Discuss your infection with your partner. Your partner may have gotten the infection from you, or you may have gotten it from your partner. °· Have your sex partner get examined and treated if necessary. °· Practice safe, informed, and protected sex. °· See your health care provider for other STI testing. °SEEK MEDICAL CARE IF:  °· You still have symptoms after you finish your medicine. °· You develop abdominal pain. °· You have pain when you urinate. °· You have bleeding after sexual intercourse. °· You develop a rash. °· Your medicine makes you sick or makes you throw up (vomit). °MAKE SURE YOU: °· Understand these instructions. °· Will watch your condition. °· Will get help right away if you are not doing well or get worse. °  °This information is not intended to replace advice given to you   by your health care provider. Make sure you discuss any questions you have with your health care provider. °  °Document Released: 07/24/2000 Document Revised: 02/18/2014 Document Reviewed: 11/09/2012 °Elsevier Interactive Patient Education ©2016 [employer] ° °

## 2014-12-09 LAB — GC/CHLAMYDIA PROBE AMP (~~LOC~~) NOT AT ARMC
Chlamydia: NEGATIVE
Neisseria Gonorrhea: NEGATIVE

## 2015-05-02 ENCOUNTER — Encounter (HOSPITAL_BASED_OUTPATIENT_CLINIC_OR_DEPARTMENT_OTHER): Payer: Self-pay | Admitting: Emergency Medicine

## 2015-05-02 ENCOUNTER — Emergency Department (HOSPITAL_BASED_OUTPATIENT_CLINIC_OR_DEPARTMENT_OTHER)
Admission: EM | Admit: 2015-05-02 | Discharge: 2015-05-02 | Disposition: A | Discharge status: Home / Self Care | Payer: Medicare HMO | Attending: Emergency Medicine | Admitting: Emergency Medicine

## 2015-05-02 DIAGNOSIS — F17210 Nicotine dependence, cigarettes, uncomplicated: Secondary | ICD-10-CM | POA: Insufficient documentation

## 2015-05-02 DIAGNOSIS — R1030 Lower abdominal pain, unspecified: Secondary | ICD-10-CM | POA: Insufficient documentation

## 2015-05-02 DIAGNOSIS — J45909 Unspecified asthma, uncomplicated: Secondary | ICD-10-CM | POA: Diagnosis not present

## 2015-05-02 DIAGNOSIS — Z3A11 11 weeks gestation of pregnancy: Secondary | ICD-10-CM | POA: Diagnosis not present

## 2015-05-02 DIAGNOSIS — O99511 Diseases of the respiratory system complicating pregnancy, first trimester: Secondary | ICD-10-CM | POA: Diagnosis not present

## 2015-05-02 DIAGNOSIS — O9989 Other specified diseases and conditions complicating pregnancy, childbirth and the puerperium: Secondary | ICD-10-CM | POA: Diagnosis present

## 2015-05-02 DIAGNOSIS — O99331 Smoking (tobacco) complicating pregnancy, first trimester: Secondary | ICD-10-CM | POA: Insufficient documentation

## 2015-05-02 LAB — URINALYSIS, ROUTINE W REFLEX MICROSCOPIC
BILIRUBIN URINE: NEGATIVE
GLUCOSE, UA: NEGATIVE mg/dL
HGB URINE DIPSTICK: NEGATIVE
Ketones, ur: NEGATIVE mg/dL
Leukocytes, UA: NEGATIVE
Nitrite: NEGATIVE
PH: 7 (ref 5.0–8.0)
Protein, ur: NEGATIVE mg/dL
SPECIFIC GRAVITY, URINE: 1.026 (ref 1.005–1.030)

## 2015-05-02 NOTE — ED Notes (Signed)
Pt reports she is [redacted] weeks pregnant, started having lower abdominal pain after chasing her youngest son, no bleeding per patient

## 2018-07-18 ENCOUNTER — Emergency Department (HOSPITAL_BASED_OUTPATIENT_CLINIC_OR_DEPARTMENT_OTHER)
Admission: EM | Admit: 2018-07-18 | Discharge: 2018-07-18 | Disposition: A | Discharge status: Home / Self Care | Payer: Medicare HMO | Attending: Emergency Medicine | Admitting: Emergency Medicine

## 2018-07-18 ENCOUNTER — Encounter (HOSPITAL_BASED_OUTPATIENT_CLINIC_OR_DEPARTMENT_OTHER): Payer: Self-pay | Admitting: *Deleted

## 2018-07-18 ENCOUNTER — Other Ambulatory Visit: Payer: Self-pay

## 2018-07-18 DIAGNOSIS — J45909 Unspecified asthma, uncomplicated: Secondary | ICD-10-CM | POA: Insufficient documentation

## 2018-07-18 DIAGNOSIS — R1011 Right upper quadrant pain: Secondary | ICD-10-CM | POA: Diagnosis present

## 2018-07-18 DIAGNOSIS — F17210 Nicotine dependence, cigarettes, uncomplicated: Secondary | ICD-10-CM | POA: Insufficient documentation

## 2018-07-18 DIAGNOSIS — Z79899 Other long term (current) drug therapy: Secondary | ICD-10-CM | POA: Diagnosis not present

## 2018-07-18 DIAGNOSIS — N200 Calculus of kidney: Secondary | ICD-10-CM | POA: Diagnosis not present

## 2018-07-18 HISTORY — DX: Calculus of kidney: N20.0

## 2018-07-18 LAB — CBC WITH DIFFERENTIAL/PLATELET
Abs Immature Granulocytes: 0.03 10*3/uL (ref 0.00–0.07)
Basophils Absolute: 0 10*3/uL (ref 0.0–0.1)
Basophils Relative: 1 %
Eosinophils Absolute: 0.1 10*3/uL (ref 0.0–0.5)
Eosinophils Relative: 1 %
HCT: 34.4 % — ABNORMAL LOW (ref 36.0–46.0)
Hemoglobin: 11.6 g/dL — ABNORMAL LOW (ref 12.0–15.0)
Immature Granulocytes: 0 %
Lymphocytes Relative: 25 %
Lymphs Abs: 2.1 10*3/uL (ref 0.7–4.0)
MCH: 33.3 pg (ref 26.0–34.0)
MCHC: 33.7 g/dL (ref 30.0–36.0)
MCV: 98.9 fL (ref 80.0–100.0)
Monocytes Absolute: 0.6 10*3/uL (ref 0.1–1.0)
Monocytes Relative: 7 %
Neutro Abs: 5.7 10*3/uL (ref 1.7–7.7)
Neutrophils Relative %: 66 %
Platelets: 291 10*3/uL (ref 150–400)
RBC: 3.48 MIL/uL — ABNORMAL LOW (ref 3.87–5.11)
RDW: 13.2 % (ref 11.5–15.5)
WBC: 8.5 10*3/uL (ref 4.0–10.5)
nRBC: 0 % (ref 0.0–0.2)

## 2018-07-18 MED ORDER — ONDANSETRON HCL 4 MG/2ML IJ SOLN
4.0000 mg | Freq: Once | INTRAMUSCULAR | Status: AC
  Administered 2018-07-18: 4 mg via INTRAVENOUS
  Filled 2018-07-18: qty 2

## 2018-07-18 MED ORDER — EQUATE NICOTINE 4 MG MT GUM
4.00 | CHEWING_GUM | OROMUCOSAL | Status: DC
Start: ? — End: 2018-07-18

## 2018-07-18 MED ORDER — OXYCODONE-ACETAMINOPHEN 5-325 MG PO TABS: 1.0000 | ORAL_TABLET | Freq: Four times a day (QID) | ORAL | 0 refills | Status: AC | PRN

## 2018-07-18 MED ORDER — HYDROMORPHONE HCL 1 MG/ML IJ SOLN
0.5000 mg | Freq: Once | INTRAMUSCULAR | Status: AC
  Administered 2018-07-18: 0.5 mg via INTRAVENOUS
  Filled 2018-07-18: qty 1

## 2018-07-18 MED ORDER — HYDROMORPHONE HCL 1 MG/ML IJ SOLN
0.5000 mg | Freq: Once | INTRAMUSCULAR | Status: AC
  Administered 2018-07-18: 18:00:00 0.5 mg via INTRAVENOUS
  Filled 2018-07-18: qty 1

## 2018-07-18 MED ORDER — ACETAMINOPHEN 325 MG PO TABS
650.0000 mg | ORAL_TABLET | Freq: Once | ORAL | Status: AC
  Administered 2018-07-18: 650 mg via ORAL
  Filled 2018-07-18: qty 2

## 2018-07-18 MED ORDER — DURASORB UNDERPAD MISC
4.00 | Status: DC
Start: ? — End: 2018-07-18

## 2018-07-18 MED ORDER — SODIUM CHLORIDE 0.9 % IV BOLUS
1000.0000 mL | Freq: Once | INTRAVENOUS | Status: AC
  Administered 2018-07-18: 1000 mL via INTRAVENOUS

## 2018-07-18 NOTE — ED Triage Notes (Signed)
Pt was seen at [facility] 2 nights ago and dx with left kidney stone. States Pain was in her back but more in LLQ now. C/o n/v and unable to get comfortable

## 2018-07-18 NOTE — ED Notes (Signed)
Provider at the bedside.  

## 2018-07-18 NOTE — Discharge Instructions (Addendum)
You were seen in the emergency department and found to have a kidney stone.  We are sending you home with multiple medications to assist with passing the stone:   -Ibuprofen 800 mg-this is a medication that will help with pain as well as passing the stone.  Please take this every 8 hours.  Take this with food as it can cause stomach upset and at worst stomach bleeding.  Do not take other NSAIDs such as Motrin, Aleve, Advil, Mobic, or Naproxen with this medicine as they are similar and would propagate any potential side effects.   -Percocet-this is a narcotic/controlled substance medication that has potential addicting qualities.  We recommend that you take 1-2 tablets every 6 hours as needed for severe pain.  Do not drive or operate heavy machinery when taking this medicine as it can be sedating. Do not drink alcohol or take other sedating medications when taking this medicine for safety reasons.  Keep this out of reach of small children.  Please be aware this medicine has Tylenol in it (325 mg/tab) do not exceed the maximum dose of Tylenol in a day per over the counter recommendations should you decide to supplement with Tylenol over the counter.   -Zofran-this is an antinausea medication, you may take this every 8 hours as needed for nausea and vomiting, please allow the tablet to dissolve underneath of your tongue.   We have prescribed you new medication(s) today. Discuss the medications prescribed today with your pharmacist as they can have adverse effects and interactions with your other medicines including over the counter and prescribed medications. Seek medical evaluation if you start to experience new or abnormal symptoms after taking one of these medicines, seek care immediately if you start to experience difficulty breathing, feeling of your throat closing, facial swelling, or rash as these could be indications of a more serious allergic reaction  Please follow-up with the urology group provided  in your discharge instructions within 3 to 5 days.  Return to the ER for new or worsening symptoms including but not limited to worsening pain not controlled by these medicines, inability to keep fluids down, fever, or any other concerns that you may have.

## 2018-07-18 NOTE — ED Provider Notes (Signed)
[facility] EMERGENCY DEPARTMENT Provider Note   CSN: [id number] Arrival date & time: 07/18/18  1717    History   Chief Complaint Chief Complaint  Patient presents with  . Abdominal Pain    HPI [name] is a 33 y.o. female with medical history of anxiety, asthma, bipolar 1 disorder, kidney stones presents emergency department today with chief complaint of abdominal pain x2 days.  Patient was seen in the emergency department yesterday and diagnosed with a left kidney stone and UTI.  She was discharged home with Toradol and Tylenol for pain, as well as Keflex.  She reports she has been unable to control her pain after taking Toradol and Tylenol.  She has nausea with emesis, estimating 4 episodes were nonbloody nonbilious.  The pain was so severe that she had to leave work early.  Yesterday the pain was in her right flank however today has moved to her left lower quadrant.  She rates the pain 8 out of 10 in severity.  She denies fever, chills, chest pain, shortness of breath, abdominal pain, diarrhea. Dr. Lindley MagnusEskew is pt's urologist and she plans to follow up with him on Monday.  History provided by patient with additional history obtained from chart review.      Past Medical History:  Diagnosis Date  . Anxiety   . Anxiety   . Asthma   . Bipolar 1 disorder (HCC)   . Bipolar 1 disorder (HCC)   . Kidney stone     There are no active problems to display for this patient.   Past Surgical History:  Procedure Laterality Date  . CERVICAL BIOPSY  W/ LOOP ELECTRODE EXCISION    . KIDNEY STONE SURGERY    . LEEP    . TONSILLECTOMY       OB History    Gravida  2   Para      Term      Preterm      AB      Living        SAB      TAB      Ectopic      Multiple      Live Births               Home Medications    Prior to Admission medications   Medication Sig Start Date End Date Taking? Authorizing Provider  cephALEXin (KEFLEX) 500 MG capsule Take  by mouth. 07/17/18 07/24/18 Yes [provider]  gabapentin (NEURONTIN) 300 MG capsule Take I capsule at bedtime 06/24/18  Yes [provider]  ketorolac (TORADOL) 10 MG tablet Take by mouth. 07/17/18  Yes [provider]  meloxicam (MOBIC) 7.5 MG tablet Take by mouth. 06/23/18 09/21/18 Yes [provider]  norethindrone-ethinyl estradiol (JUNEL FE 1/20) 1-20 MG-MCG tablet TAKE 1 TABLET BY MOUTH EVERY DAY SKIPPING THE SUGAR PILL WEEK AND TAKING IN A CONTINOUS FASHION 04/24/17  Yes [provider]  ondansetron (ZOFRAN ODT) 4 MG disintegrating tablet Take 1 tablet (4 mg total) by mouth every 4 (four) hours as needed for nausea or vomiting. 12/08/14  Yes Pfeiffer, Lebron ConnersMarcy, MD  busPIRone (BUSPAR) 15 MG tablet Take 15 mg by mouth 3 (three) times daily.    [provider]  cephALEXin (KEFLEX) 500 MG capsule Take 2 capsules (1,000 mg total) by mouth 2 (two) times daily. 12/08/14   Arby BarrettePfeiffer, Marcy, MD  clindamycin (CLEOCIN) 300 MG capsule Take 1 capsule (300 mg total) by mouth 4 (  four) times daily. X 7 days 07/02/13   Randal Buba, April, MD  doxycycline (VIBRA-TABS) 100 MG tablet Take 1 tablet (100 mg total) by mouth 2 (two) times daily. 08/25/13   Evelina Bucy, MD  doxycycline (VIBRAMYCIN) 100 MG capsule Take 1 capsule (100 mg total) by mouth 2 (two) times daily. One po bid x 7 days 12/08/14   Charlesetta Shanks, MD  meloxicam (MOBIC) 7.5 MG tablet Take 1 tablet (7.5 mg total) by mouth daily. 07/02/13   Palumbo, April, MD  metroNIDAZOLE (FLAGYL) 500 MG tablet Take 1 tablet (500 mg total) by mouth 2 (two) times daily. One po bid x 7 days 12/08/14   Charlesetta Shanks, MD  ondansetron (ZOFRAN ODT) 8 MG disintegrating tablet Take 1 tablet (8 mg total) by mouth every 8 (eight) hours as needed for nausea or vomiting. 02/06/13   Ashley Murrain, NP  oxyCODONE-acetaminophen (PERCOCET) 5-325 MG per tablet Take 1 tablet by mouth every 6 (six) hours as needed for moderate pain. 08/25/13    Evelina Bucy, MD  oxyCODONE-acetaminophen (PERCOCET) 5-325 MG per tablet Take 1 tablet by mouth every 4 (four) hours as needed for moderate pain. 0/0/93   Delora Fuel, MD  sertraline (ZOLOFT) 100 MG tablet Take 100 mg by mouth daily.    [provider]    Family History No family history on file.  Social History Social History   Tobacco Use  . Smoking status: Current Every Day Smoker    Packs/day: 0.50    Types: Cigarettes  . Smokeless tobacco: Never Used  Substance Use Topics  . Alcohol use: No  . Drug use: Yes    Types: Marijuana     Allergies   Tape; Amoxicillin-pot clavulanate; and Codeine   Review of Systems Review of Systems  Constitutional: Negative for chills and fever.  HENT: Negative for congestion, ear discharge, ear pain, sinus pressure, sinus pain and sore throat.   Eyes: Negative for pain and redness.  Respiratory: Negative for cough and shortness of breath.   Cardiovascular: Negative for chest pain.  Gastrointestinal: Positive for abdominal pain, nausea and vomiting. Negative for constipation and diarrhea.  Genitourinary: Negative for dysuria and hematuria.  Musculoskeletal: Negative for back pain and neck pain.  Skin: Negative for wound.  Neurological: Negative for weakness, numbness and headaches.     Physical Exam Updated Vital Signs BP 126/79   Pulse 84   Temp 98.8 F (37.1 C) (Oral)   Resp 18   Ht 5\' 3"  (1.6 m)   Wt 77.6 kg   SpO2 99%   Breastfeeding No   BMI 30.29 kg/m   Physical Exam Vitals signs and nursing note reviewed.  Constitutional:      General: She is not in acute distress.    Appearance: She is not ill-appearing.     Comments: Patient is tearful, she is uncomfortable appearing.  She is rocking back and forth while sitting on the stretcher.  She is in no acute distress.  HENT:     Head: Normocephalic and atraumatic.     Right Ear: Tympanic membrane and external ear normal.     Left Ear: Tympanic membrane and  external ear normal.     Nose: Nose normal.     Mouth/Throat:     Mouth: Mucous membranes are moist.     Pharynx: Oropharynx is clear.  Eyes:     General: No scleral icterus.       Right eye: No discharge.  Left eye: No discharge.     Extraocular Movements: Extraocular movements intact.     Conjunctiva/sclera: Conjunctivae normal.     Pupils: Pupils are equal, round, and reactive to light.  Neck:     Musculoskeletal: Normal range of motion.     Vascular: No JVD.  Cardiovascular:     Rate and Rhythm: Normal rate and regular rhythm.     Pulses: Normal pulses.          Radial pulses are 2+ on the right side and 2+ on the left side.     Heart sounds: Normal heart sounds.  Pulmonary:     Comments: Lungs clear to auscultation in all fields. Symmetric chest rise. No wheezing, rales, or rhonchi. Abdominal:     Tenderness: There is abdominal tenderness in the left lower quadrant. There is no right CVA tenderness or left CVA tenderness.     Comments: Abdomen is soft, non-distended, and non-tender in all quadrants. No rigidity, no guarding. No peritoneal signs.  Musculoskeletal: Normal range of motion.  Skin:    General: Skin is warm and dry.     Capillary Refill: Capillary refill takes less than 2 seconds.  Neurological:     Mental Status: She is oriented to person, place, and time.     GCS: GCS eye subscore is 4. GCS verbal subscore is 5. GCS motor subscore is 6.     Comments: Fluent speech, no facial droop.  Psychiatric:        Behavior: Behavior normal.      ED Treatments / Results  Labs (all labs ordered are listed, but only abnormal results are displayed) Labs Reviewed  CBC WITH DIFFERENTIAL/PLATELET - Abnormal; Notable for the following components:      Result Value   RBC 3.48 (*)    Hemoglobin 11.6 (*)    HCT 34.4 (*)    All other components within normal limits    EKG None  Radiology No results found.  Procedures Procedures (including critical care time)   Medications Ordered in ED Medications  sodium chloride 0.9 % bolus 1,000 mL (1,000 mLs Intravenous New Bag/Given 07/18/18 1811)  ondansetron (ZOFRAN) injection 4 mg (4 mg Intravenous Given 07/18/18 1811)  HYDROmorphone (DILAUDID) injection 0.5 mg (0.5 mg Intravenous Given 07/18/18 1811)  acetaminophen (TYLENOL) tablet 650 mg (650 mg Oral Given 07/18/18 1917)  ondansetron (ZOFRAN) injection 4 mg (4 mg Intravenous Given 07/18/18 1915)  HYDROmorphone (DILAUDID) injection 0.5 mg (0.5 mg Intravenous Given 07/18/18 1918)     Initial Impression / Assessment and Plan / ED Course  I have reviewed the triage vital signs and the nursing notes.  Pertinent labs & imaging results that were available during my care of the patient were reviewed by me and considered in my medical decision making (see chart for details).     Pt has been diagnosed with a kidney stone via CT on 07/17/18 at another ED. Chart review from yesterday's ED visit shows her work-up included CMP, UA and CT A/P.  Results for CMP were unremarkable, UA suggestive of UTI, and CT shows 3mm stone at left UVJ with mild left hydronephrosis.  On exam today she has tenderness to palpation of left groin which is consistent with stone likely moving towards bladder. Serum creatinine was within normal limits. Did check CBC today and it is without leukocytosis, overall unremarkable. Pt given IV pain medications and zofran for nausea. On reassessment pt is pain free. She is tolerating PO intake with gingerale and crackers.  Vitals sign stable and the pt does not have irratractable vomiting. Pt will be dc home with pain medications & has been advised to follow up with PCP/urology.   This note was prepared using Dragon voice recognition software and may include unintentional dictation errors due to the inherent limitations of voice recognition software.    Final Clinical Impressions(s) / ED Diagnoses   Final diagnoses:  None    ED Discharge Orders    None        Kathyrn Lasslbrizze, Rachelle Edwards E, PA-C 07/18/18 2047    Loren RacerYelverton, David, MD 07/18/18 2146

## 2019-06-05 ENCOUNTER — Encounter (HOSPITAL_BASED_OUTPATIENT_CLINIC_OR_DEPARTMENT_OTHER): Payer: Self-pay | Admitting: Emergency Medicine

## 2019-06-05 ENCOUNTER — Other Ambulatory Visit: Payer: Self-pay

## 2019-06-05 ENCOUNTER — Emergency Department (HOSPITAL_BASED_OUTPATIENT_CLINIC_OR_DEPARTMENT_OTHER)
Admission: EM | Admit: 2019-06-05 | Discharge: 2019-06-05 | Disposition: A | Discharge status: Home / Self Care | Payer: Medicare HMO | Attending: Emergency Medicine | Admitting: Emergency Medicine

## 2019-06-05 DIAGNOSIS — J069 Acute upper respiratory infection, unspecified: Secondary | ICD-10-CM | POA: Diagnosis not present

## 2019-06-05 DIAGNOSIS — F17210 Nicotine dependence, cigarettes, uncomplicated: Secondary | ICD-10-CM | POA: Diagnosis not present

## 2019-06-05 DIAGNOSIS — R0981 Nasal congestion: Secondary | ICD-10-CM | POA: Insufficient documentation

## 2019-06-05 DIAGNOSIS — Z20822 Contact with and (suspected) exposure to covid-19: Secondary | ICD-10-CM | POA: Diagnosis not present

## 2019-06-05 DIAGNOSIS — R05 Cough: Secondary | ICD-10-CM | POA: Diagnosis present

## 2019-06-05 DIAGNOSIS — R070 Pain in throat: Secondary | ICD-10-CM | POA: Diagnosis not present

## 2019-06-05 DIAGNOSIS — J45909 Unspecified asthma, uncomplicated: Secondary | ICD-10-CM | POA: Insufficient documentation

## 2019-06-05 DIAGNOSIS — Z79899 Other long term (current) drug therapy: Secondary | ICD-10-CM | POA: Insufficient documentation

## 2019-06-05 LAB — SARS CORONAVIRUS 2 (TAT 6-24 HRS): SARS Coronavirus 2: NEGATIVE

## 2019-06-05 LAB — SARS CORONAVIRUS 2 AG (30 MIN TAT): SARS Coronavirus 2 Ag: NEGATIVE

## 2019-06-05 MED ORDER — BENZONATATE 100 MG PO CAPS: 100.0000 mg | ORAL_CAPSULE | Freq: Three times a day (TID) | ORAL | 0 refills | Status: AC

## 2019-06-05 MED ORDER — DOXYCYCLINE HYCLATE 100 MG PO CAPS: 100.0000 mg | ORAL_CAPSULE | Freq: Two times a day (BID) | ORAL | 0 refills | Status: AC

## 2019-06-05 MED ORDER — FLUTICASONE PROPIONATE 50 MCG/ACT NA SUSP: 2.0000 | Freq: Every day | NASAL | 0 refills | Status: AC

## 2019-06-05 MED ORDER — ACETAMINOPHEN 325 MG PO TABS
650.0000 mg | ORAL_TABLET | Freq: Once | ORAL | Status: AC
  Administered 2019-06-05: 13:00:00 650 mg via ORAL
  Filled 2019-06-05: qty 2

## 2019-06-05 NOTE — Discharge Instructions (Addendum)
You were given a prescription for antibiotics.  If your Covid test is negative then please fill this prescription and take it accordingly.  If the Covid test is positive then you will not need to fill this prescription.  You are also given a prescription for Flonase and Tessalon Perles to help with your nasal congestion and cough.  Please take as directed.  You were tested for the coronavirus today.  The results to be available within the next 24 hours.  If the results are positive the hospital contact you.  The results are negative the hospital not contact you but you can check for the results on your my chart.    If the results of your COVID test are positive, You should be isolated for at least 7 days since the onset of your symptoms AND >72 hours after symptoms resolution (absence of fever without the use of fever reducing medicaiton and improvement in respiratory symptoms), whichever is longer  Please follow up with your primary care provider within 5-7 days for re-evaluation of your symptoms. If you do not have a primary care provider, information for a healthcare clinic has been provided for you to make arrangements for follow up care. Please return to the emergency department for any new or worsening symptoms.

## 2019-06-05 NOTE — ED Notes (Signed)
Pt discharged to home. Discharge instructions have been discussed with patient and/or family members. Pt verbally acknowledges understanding d/c instructions, and endorses comprehension to checkout at registration before leaving.  °

## 2019-06-05 NOTE — ED Notes (Signed)
Pharmacy and medications updated with patient 

## 2019-06-05 NOTE — ED Provider Notes (Signed)
[facility] EMERGENCY DEPARTMENT Provider Note   CSN: [id number] Arrival date & time: 06/05/19  1228     History Chief Complaint  Patient presents with  . Cough    [name] is a 34 y.o. female.  HPI   Pt is a 34 y/o female with a h/o anxiety, bipolar disorder, nephrolithiasis who presents to the ED today for eval of mult complaints. She is c/o sore throat, rhinorrhea, congestion, ear pain, cough, sneezing, body aches. She reports subjective fevers at home but has not taken her temp. She also had NVD when her sxs first started but this has resolved.   Past Medical History:  Diagnosis Date  . Anxiety   . Anxiety   . Asthma   . Bipolar 1 disorder (HCC)   . Bipolar 1 disorder (HCC)   . Kidney stone     There are no problems to display for this patient.   Past Surgical History:  Procedure Laterality Date  . CERVICAL BIOPSY  W/ LOOP ELECTRODE EXCISION    . KIDNEY STONE SURGERY    . LEEP    . TONSILLECTOMY       OB History    Gravida  2   Para      Term      Preterm      AB      Living        SAB      TAB      Ectopic      Multiple      Live Births              No family history on file.  Social History   Tobacco Use  . Smoking status: Current Every Day Smoker    Packs/day: 0.50    Types: Cigarettes  . Smokeless tobacco: Never Used  Substance Use Topics  . Alcohol use: No  . Drug use: Yes    Types: Marijuana    Home Medications Prior to Admission medications   Medication Sig Start Date End Date Taking? Authorizing Provider  benzonatate (TESSALON) 100 MG capsule Take 1 capsule (100 mg total) by mouth every 8 (eight) hours for 5 days. 06/05/19 06/10/19  [place] Silveria S, PA-C  busPIRone (BUSPAR) 15 MG tablet Take 15 mg by mouth 3 (three) times daily.    [provider]  cephALEXin (KEFLEX) 500 MG capsule Take 2 capsules (1,000 mg total) by mouth 2 (two) times daily. 12/08/14   Arby Barrette, MD  clindamycin  (CLEOCIN) 300 MG capsule Take 1 capsule (300 mg total) by mouth 4 (four) times daily. X 7 days 07/02/13   Nicanor Alcon, April, MD  doxycycline (VIBRAMYCIN) 100 MG capsule Take 1 capsule (100 mg total) by mouth 2 (two) times daily for 7 days. 06/05/19 06/12/19  Ethanael Veith S, PA-C  fluticasone (FLONASE) 50 MCG/ACT nasal spray Place 2 sprays into both nostrils daily. 06/05/19   Sadaf Przybysz S, PA-C  gabapentin (NEURONTIN) 300 MG capsule Take I capsule at bedtime 06/24/18   [provider]  ketorolac (TORADOL) 10 MG tablet Take by mouth. 07/17/18   [provider]  meloxicam (MOBIC) 7.5 MG tablet Take 1 tablet (7.5 mg total) by mouth daily. 07/02/13   Palumbo, April, MD  metroNIDAZOLE (FLAGYL) 500 MG tablet Take 1 tablet (500 mg total) by mouth 2 (two) times daily. One po bid x 7 days 12/08/14   Arby Barrette, MD  norethindrone-ethinyl estradiol (JUNEL FE 1/20) 1-20 MG-MCG tablet TAKE  1 TABLET BY MOUTH EVERY DAY SKIPPING THE SUGAR PILL WEEK AND TAKING IN A CONTINOUS FASHION 04/24/17   [provider]  ondansetron (ZOFRAN ODT) 4 MG disintegrating tablet Take 1 tablet (4 mg total) by mouth every 4 (four) hours as needed for nausea or vomiting. 12/08/14   Arby Barrette, MD  ondansetron (ZOFRAN ODT) 8 MG disintegrating tablet Take 1 tablet (8 mg total) by mouth every 8 (eight) hours as needed for nausea or vomiting. 02/06/13   Janne Napoleon, NP  oxyCODONE-acetaminophen (PERCOCET/ROXICET) 5-325 MG tablet Take 1 tablet by mouth every 6 (six) hours as needed for severe pain. 07/18/18   Albrizze, Kaitlyn E, PA-C  sertraline (ZOLOFT) 100 MG tablet Take 100 mg by mouth daily.    [provider]    Allergies    Tape, Amoxicillin-pot clavulanate, and Codeine  Review of Systems   Review of Systems  Constitutional: Positive for chills and diaphoresis. Negative for fever.  HENT: Positive for congestion, rhinorrhea and sore throat. Negative for ear pain.   Eyes: Negative for visual  disturbance.  Respiratory: Positive for cough. Negative for shortness of breath.   Cardiovascular: Negative for chest pain.  Gastrointestinal: Positive for diarrhea (resolved), nausea and vomiting (resvoled). Negative for abdominal pain.  Genitourinary: Negative for dysuria and hematuria.  Musculoskeletal: Positive for myalgias.  Skin: Negative for color change and rash.  Neurological: Positive for headaches.  All other systems reviewed and are negative.   Physical Exam Updated Vital Signs BP (!) 144/91 (BP Location: Left Arm)   Pulse 98   Temp 98.4 F (36.9 C) (Oral)   Resp 16   Ht 5\' 2"  (1.575 m)   Wt 76.7 kg   SpO2 100%   BMI 30.91 kg/m   Physical Exam Vitals and nursing note reviewed.  Constitutional:      General: She is not in acute distress.    Appearance: She is well-developed.  HENT:     Head: Normocephalic and atraumatic.     Ears:     Comments: Bilateral TMs are bulging however they are nonerythematous.  There is serous fluid behind each of them.    Nose: Congestion present.     Mouth/Throat:     Mouth: Mucous membranes are moist.  Eyes:     Conjunctiva/sclera: Conjunctivae normal.  Cardiovascular:     Rate and Rhythm: Normal rate and regular rhythm.     Heart sounds: Normal heart sounds. No murmur.  Pulmonary:     Effort: Pulmonary effort is normal. No respiratory distress.     Breath sounds: Normal breath sounds. No wheezing, rhonchi or rales.  Abdominal:     General: Bowel sounds are normal.     Palpations: Abdomen is soft.     Tenderness: There is no abdominal tenderness. There is no guarding or rebound.  Musculoskeletal:     Cervical back: Neck supple.  Skin:    General: Skin is warm and dry.  Neurological:     Mental Status: She is alert.     ED Results / Procedures / Treatments   Labs (all labs ordered are listed, but only abnormal results are displayed) Labs Reviewed  SARS CORONAVIRUS 2 AG (30 MIN TAT)  SARS CORONAVIRUS 2 (TAT 6-24 HRS)     EKG None  Radiology No results found.  Procedures Procedures (including critical care time)  Medications Ordered in ED Medications  acetaminophen (TYLENOL) tablet 650 mg (650 mg Oral Given 06/05/19 1249)    ED Course  I  have reviewed the triage vital signs and the nursing notes.  Pertinent labs & imaging results that were available during my care of the patient were reviewed by me and considered in my medical decision making (see chart for details).    MDM Rules/Calculators/A&P                      Patient presenting for evaluation for URI sxs.  Reports symptoms ongoing for several days.  Patient nontoxic, well-appearing, no distress.  Vital signs are reassuring.  Tested for Covid in the ED. Results of POC testing are neg. Will get sendout test. Discussed that if results of test are negative, I will give rx for doxycycline to cover potential bacterial sinus infection given her fever and sxs today. Advised if test is positive she will not need to fill rx for abx.  Will also give Rx for symptomatic management including tessalon and fluticasone. Advised on quarantine measures. Advised on f/u and return precautions. Pt voiced understanding of the plan and reasons to return. All questions answered, pt stable for d/c.  ----  [name] was evaluated in Emergency Department on 06/05/2019 for the symptoms described in the history of present illness. She was evaluated in the context of the global COVID-19 pandemic, which necessitated consideration that the patient might be at risk for infection with the SARS-CoV-2 virus that causes COVID-19. Institutional protocols and algorithms that pertain to the evaluation of patients at risk for COVID-19 are in a state of rapid change based on information released by regulatory bodies including the CDC and federal and state organizations. These policies and algorithms were followed during the patient's care in the ED.   Final Clinical  Impression(s) / ED Diagnoses Final diagnoses:  Upper respiratory tract infection, unspecified type  Person under investigation for COVID-19    Rx / DC Orders ED Discharge Orders         Ordered    doxycycline (VIBRAMYCIN) 100 MG capsule  2 times daily     06/05/19 1426    fluticasone (FLONASE) 50 MCG/ACT nasal spray  Daily     06/05/19 1426    benzonatate (TESSALON) 100 MG capsule  Every 8 hours     06/05/19 1426           Juelz Whittenberg S, PA-C 06/05/19 1427    Gareth Morgan, MD 06/05/19 2252

## 2019-06-05 NOTE — ED Triage Notes (Signed)
Cough, sneezing, headache, body aches x 2 days.
